# Patient Record
Sex: Male | Born: 1980 | Race: White | Hispanic: No | Marital: Single | Smoking: Current every day smoker
Health system: Southern US, Community
[De-identification: ages and names within clinical notes are randomized; demographics above are authoritative.]

## PROBLEM LIST (undated history)

## (undated) DIAGNOSIS — IMO0002 Reserved for concepts with insufficient information to code with codable children: Secondary | ICD-10-CM

## (undated) DIAGNOSIS — F319 Bipolar disorder, unspecified: Secondary | ICD-10-CM

## (undated) DIAGNOSIS — F419 Anxiety disorder, unspecified: Secondary | ICD-10-CM

## (undated) DIAGNOSIS — S0990XA Unspecified injury of head, initial encounter: Secondary | ICD-10-CM

## (undated) DIAGNOSIS — F329 Major depressive disorder, single episode, unspecified: Secondary | ICD-10-CM

## (undated) DIAGNOSIS — F32A Depression, unspecified: Secondary | ICD-10-CM

## (undated) DIAGNOSIS — R569 Unspecified convulsions: Secondary | ICD-10-CM

## (undated) DIAGNOSIS — S72009A Fracture of unspecified part of neck of unspecified femur, initial encounter for closed fracture: Secondary | ICD-10-CM

## (undated) HISTORY — PX: HIP SURGERY: SHX245

---

## 2003-10-13 ENCOUNTER — Inpatient Hospital Stay (HOSPITAL_COMMUNITY): Admission: RE | Admit: 2003-10-13 | Discharge: 2003-10-18 | Payer: Self-pay | Admitting: Psychiatry

## 2006-02-04 ENCOUNTER — Emergency Department (HOSPITAL_COMMUNITY): Admission: EM | Admit: 2006-02-04 | Discharge: 2006-02-04 | Payer: Self-pay | Admitting: *Deleted

## 2006-02-12 ENCOUNTER — Emergency Department (HOSPITAL_COMMUNITY): Admission: EM | Admit: 2006-02-12 | Discharge: 2006-02-12 | Payer: Self-pay | Admitting: *Deleted

## 2006-04-16 ENCOUNTER — Emergency Department (HOSPITAL_COMMUNITY): Admission: EM | Admit: 2006-04-16 | Discharge: 2006-04-16 | Payer: Self-pay | Admitting: Emergency Medicine

## 2007-06-19 ENCOUNTER — Ambulatory Visit: Payer: Self-pay | Admitting: Psychiatry

## 2007-06-19 ENCOUNTER — Inpatient Hospital Stay (HOSPITAL_COMMUNITY): Admission: EM | Admit: 2007-06-19 | Discharge: 2007-07-01 | Payer: Self-pay | Admitting: Psychiatry

## 2007-11-30 ENCOUNTER — Ambulatory Visit: Payer: Self-pay | Admitting: *Deleted

## 2007-11-30 ENCOUNTER — Inpatient Hospital Stay (HOSPITAL_COMMUNITY): Admission: AD | Admit: 2007-11-30 | Discharge: 2007-12-15 | Payer: Self-pay | Admitting: *Deleted

## 2008-01-15 ENCOUNTER — Emergency Department (HOSPITAL_COMMUNITY): Admission: EM | Admit: 2008-01-15 | Discharge: 2008-01-16 | Payer: Self-pay | Admitting: Emergency Medicine

## 2008-01-16 ENCOUNTER — Ambulatory Visit: Payer: Self-pay | Admitting: Psychiatry

## 2008-01-16 ENCOUNTER — Inpatient Hospital Stay (HOSPITAL_COMMUNITY): Admission: AD | Admit: 2008-01-16 | Discharge: 2008-01-29 | Payer: Self-pay | Admitting: Psychiatry

## 2008-02-04 ENCOUNTER — Inpatient Hospital Stay (HOSPITAL_COMMUNITY): Admission: RE | Admit: 2008-02-04 | Discharge: 2008-02-25 | Payer: Self-pay | Admitting: Psychiatry

## 2008-03-07 ENCOUNTER — Ambulatory Visit: Payer: Self-pay | Admitting: Psychiatry

## 2008-03-07 ENCOUNTER — Inpatient Hospital Stay (HOSPITAL_COMMUNITY): Admission: AD | Admit: 2008-03-07 | Discharge: 2008-03-08 | Payer: Self-pay | Admitting: Psychiatry

## 2008-09-11 ENCOUNTER — Inpatient Hospital Stay (HOSPITAL_COMMUNITY): Admission: RE | Admit: 2008-09-11 | Discharge: 2008-09-13 | Payer: Self-pay | Admitting: Psychiatry

## 2008-09-11 ENCOUNTER — Ambulatory Visit: Payer: Self-pay | Admitting: Psychiatry

## 2008-09-11 ENCOUNTER — Emergency Department (HOSPITAL_COMMUNITY): Admission: EM | Admit: 2008-09-11 | Discharge: 2008-09-11 | Payer: Self-pay | Admitting: Emergency Medicine

## 2009-01-05 ENCOUNTER — Emergency Department (HOSPITAL_COMMUNITY): Admission: EM | Admit: 2009-01-05 | Discharge: 2009-01-06 | Payer: Self-pay | Admitting: Emergency Medicine

## 2009-01-06 ENCOUNTER — Inpatient Hospital Stay (HOSPITAL_COMMUNITY): Admission: AD | Admit: 2009-01-06 | Discharge: 2009-01-16 | Payer: Self-pay | Admitting: *Deleted

## 2009-01-06 ENCOUNTER — Ambulatory Visit: Payer: Self-pay | Admitting: *Deleted

## 2009-05-08 ENCOUNTER — Ambulatory Visit: Payer: Self-pay | Admitting: Psychiatry

## 2009-05-08 ENCOUNTER — Emergency Department (HOSPITAL_COMMUNITY): Admission: EM | Admit: 2009-05-08 | Discharge: 2009-05-08 | Payer: Self-pay | Admitting: Emergency Medicine

## 2009-05-08 ENCOUNTER — Inpatient Hospital Stay (HOSPITAL_COMMUNITY): Admission: EM | Admit: 2009-05-08 | Discharge: 2009-06-04 | Payer: Self-pay | Admitting: Psychiatry

## 2009-11-10 ENCOUNTER — Other Ambulatory Visit: Payer: Self-pay | Admitting: Emergency Medicine

## 2009-11-14 ENCOUNTER — Emergency Department (HOSPITAL_COMMUNITY): Admission: EM | Admit: 2009-11-14 | Discharge: 2009-11-16 | Payer: Self-pay | Admitting: Emergency Medicine

## 2009-11-16 ENCOUNTER — Ambulatory Visit: Payer: Self-pay | Admitting: Psychiatry

## 2009-11-16 ENCOUNTER — Inpatient Hospital Stay (HOSPITAL_COMMUNITY): Admission: EM | Admit: 2009-11-16 | Discharge: 2009-12-03 | Payer: Self-pay | Admitting: Psychiatry

## 2010-04-12 ENCOUNTER — Emergency Department (HOSPITAL_COMMUNITY)
Admission: EM | Admit: 2010-04-12 | Discharge: 2010-04-14 | Payer: Self-pay | Source: Home / Self Care | Admitting: Emergency Medicine

## 2010-07-30 LAB — BASIC METABOLIC PANEL
CO2: 25 mEq/L (ref 19–32)
Calcium: 10.2 mg/dL (ref 8.4–10.5)
Chloride: 106 mEq/L (ref 96–112)
Creatinine, Ser: 1.14 mg/dL (ref 0.4–1.5)
GFR calc Af Amer: >60 mL/min (ref >60)
Glucose, Bld: 86 mg/dL (ref 70–99)
Potassium: 4.3 mEq/L (ref 3.5–5.1)
Sodium: 142 mEq/L (ref 135–145)

## 2010-07-30 LAB — TRICYCLICS SCREEN, URINE: TCA Scrn: NOT DETECTED

## 2010-07-30 LAB — DIFFERENTIAL
Basophils Absolute: 0 10*3/uL (ref 0.0–0.1)
Eosinophils Absolute: 0.1 10*3/uL (ref 0.0–0.7)
Lymphocytes Relative: 33 % (ref 12–46)
Neutro Abs: 5.3 10*3/uL (ref 1.7–7.7)
Neutrophils Relative %: 60 % (ref 43–77)

## 2010-07-30 LAB — CBC
Platelets: 373 10*3/uL (ref 150–400)
RBC: 5.11 MIL/uL (ref 4.22–5.81)

## 2010-07-30 LAB — RAPID URINE DRUG SCREEN, HOSP PERFORMED: Amphetamines: NOT DETECTED

## 2010-08-03 LAB — VALPROIC ACID LEVEL: Valproic Acid Lvl: 46.3 ug/mL — ABNORMAL LOW (ref 50.0–100.0)

## 2010-08-04 LAB — CBC
HCT: 38.8 % — ABNORMAL LOW (ref 39.0–52.0)
HCT: 42.7 % (ref 39.0–52.0)
Hemoglobin: 13.5 g/dL (ref 13.0–17.0)
MCH: 33.1 pg (ref 26.0–34.0)
MCH: 33.3 pg (ref 26.0–34.0)
MCHC: 34.9 g/dL (ref 30.0–36.0)
MCHC: 34.9 g/dL (ref 30.0–36.0)
MCV: 92.2 fL (ref 78.0–100.0)
MCV: 94.6 fL (ref 78.0–100.0)
MCV: 95.2 fL (ref 78.0–100.0)
Platelets: 304 10*3/uL (ref 150–400)
Platelets: 334 10*3/uL (ref 150–400)
Platelets: 352 10*3/uL (ref 150–400)
Platelets: 484 10*3/uL — ABNORMAL HIGH (ref 150–400)
RBC: 4.51 MIL/uL (ref 4.22–5.81)
RBC: 4.65 MIL/uL (ref 4.22–5.81)
RBC: 4.9 MIL/uL (ref 4.22–5.81)
RDW: 12.7 % (ref 11.5–15.5)
WBC: 6.2 10*3/uL (ref 4.0–10.5)

## 2010-08-04 LAB — VALPROIC ACID LEVEL
Valproic Acid Lvl: 109.2 ug/mL — ABNORMAL HIGH (ref 50.0–100.0)
Valproic Acid Lvl: 48 ug/mL — ABNORMAL LOW (ref 50.0–100.0)
Valproic Acid Lvl: 54 ug/mL (ref 50.0–100.0)
Valproic Acid Lvl: 71.8 ug/mL (ref 50.0–100.0)
Valproic Acid Lvl: <10 ug/mL — ABNORMAL LOW (ref 50.0–100.0)

## 2010-08-04 LAB — DIFFERENTIAL
Basophils Absolute: 0 10*3/uL (ref 0.0–0.1)
Basophils Relative: 0 % (ref 0–1)
Eosinophils Absolute: 0.1 10*3/uL (ref 0.0–0.7)
Eosinophils Absolute: 0.1 10*3/uL (ref 0.0–0.7)
Eosinophils Relative: 1 % (ref 0–5)
Eosinophils Relative: 2 % (ref 0–5)
Lymphocytes Relative: 38 % (ref 12–46)
Lymphs Abs: 1.8 10*3/uL (ref 0.7–4.0)
Lymphs Abs: 2.7 10*3/uL (ref 0.7–4.0)
Monocytes Absolute: 0.5 10*3/uL (ref 0.1–1.0)
Monocytes Relative: 13 % — ABNORMAL HIGH (ref 3–12)
Neutro Abs: 3.9 10*3/uL (ref 1.7–7.7)
Neutrophils Relative %: 46 % (ref 43–77)
Neutrophils Relative %: 83 % — ABNORMAL HIGH (ref 43–77)

## 2010-08-04 LAB — HEPATIC FUNCTION PANEL
ALT: 16 U/L (ref 0–53)
AST: 16 U/L (ref 0–37)
AST: 17 U/L (ref 0–37)
Albumin: 3.3 g/dL — ABNORMAL LOW (ref 3.5–5.2)
Albumin: 3.6 g/dL (ref 3.5–5.2)
Alkaline Phosphatase: 61 U/L (ref 39–117)
Alkaline Phosphatase: 66 U/L (ref 39–117)
Bilirubin, Direct: 0.1 mg/dL (ref 0.0–0.3)
Indirect Bilirubin: 0.6 mg/dL (ref 0.3–0.9)
Total Protein: 6.6 g/dL (ref 6.0–8.3)

## 2010-08-04 LAB — AMMONIA: Ammonia: 46 umol/L — ABNORMAL HIGH (ref 11–35)

## 2010-08-04 LAB — POCT I-STAT, CHEM 8
BUN: 11 mg/dL (ref 6–23)
Calcium, Ion: 1.17 mmol/L (ref 1.12–1.32)
Creatinine, Ser: 0.8 mg/dL (ref 0.4–1.5)
Hemoglobin: 15 g/dL (ref 13.0–17.0)
Sodium: 140 mEq/L (ref 135–145)
TCO2: 27 mmol/L (ref 0–100)

## 2010-08-04 LAB — COMPREHENSIVE METABOLIC PANEL
ALT: 14 U/L (ref 0–53)
AST: 14 U/L (ref 0–37)
Albumin: 4.5 g/dL (ref 3.5–5.2)
Alkaline Phosphatase: 78 U/L (ref 39–117)
BUN: 10 mg/dL (ref 6–23)
Calcium: 9.3 mg/dL (ref 8.4–10.5)
Chloride: 93 mEq/L — ABNORMAL LOW (ref 96–112)
Creatinine, Ser: 0.85 mg/dL (ref 0.4–1.5)
GFR calc Af Amer: >60 mL/min (ref >60)
Glucose, Bld: 97 mg/dL (ref 70–99)
Potassium: 4.4 mEq/L (ref 3.5–5.1)
Sodium: 135 mEq/L (ref 135–145)

## 2010-08-04 LAB — RAPID URINE DRUG SCREEN, HOSP PERFORMED
Amphetamines: NOT DETECTED
Barbiturates: NOT DETECTED
Benzodiazepines: NOT DETECTED
Cocaine: NOT DETECTED
Opiates: NOT DETECTED

## 2010-08-04 LAB — BASIC METABOLIC PANEL
BUN: 11 mg/dL (ref 6–23)
CO2: 25 mEq/L (ref 19–32)
Calcium: 9.3 mg/dL (ref 8.4–10.5)
Chloride: 104 mEq/L (ref 96–112)
Creatinine, Ser: 1.19 mg/dL (ref 0.4–1.5)
Glucose, Bld: 106 mg/dL — ABNORMAL HIGH (ref 70–99)

## 2010-08-19 LAB — LIPID PANEL
HDL: 40 mg/dL (ref >39)
Total CHOL/HDL Ratio: 4.7 RATIO

## 2010-08-19 LAB — COMPREHENSIVE METABOLIC PANEL
ALT: 27 U/L (ref 0–53)
Alkaline Phosphatase: 92 U/L (ref 39–117)
BUN: 9 mg/dL (ref 6–23)
CO2: 25 mEq/L (ref 19–32)
GFR calc non Af Amer: >60 mL/min (ref >60)
Glucose, Bld: 139 mg/dL — ABNORMAL HIGH (ref 70–99)
Potassium: 3.5 mEq/L (ref 3.5–5.1)
Sodium: 140 mEq/L (ref 135–145)
Total Bilirubin: 0.9 mg/dL (ref 0.3–1.2)

## 2010-08-19 LAB — DIFFERENTIAL
Basophils Absolute: 0 10*3/uL (ref 0.0–0.1)
Basophils Relative: 0 % (ref 0–1)
Eosinophils Absolute: 0 10*3/uL (ref 0.0–0.7)
Neutrophils Relative %: 81 % — ABNORMAL HIGH (ref 43–77)

## 2010-08-19 LAB — ETHANOL: Alcohol, Ethyl (B): <5 mg/dL (ref 0–10)

## 2010-08-19 LAB — CBC
HCT: 46.5 % (ref 39.0–52.0)
Hemoglobin: 16.1 g/dL (ref 13.0–17.0)
RBC: 5.03 MIL/uL (ref 4.22–5.81)

## 2010-08-19 LAB — RPR: RPR Ser Ql: NONREACTIVE

## 2010-08-19 LAB — RAPID URINE DRUG SCREEN, HOSP PERFORMED
Opiates: NOT DETECTED
Tetrahydrocannabinol: NOT DETECTED

## 2010-08-19 LAB — TSH: TSH: 1.387 u[IU]/mL (ref 0.350–4.500)

## 2010-08-19 LAB — VALPROIC ACID LEVEL: Valproic Acid Lvl: 28.8 ug/mL — ABNORMAL LOW (ref 50.0–100.0)

## 2010-08-24 LAB — URINALYSIS, ROUTINE W REFLEX MICROSCOPIC
Glucose, UA: NEGATIVE mg/dL
Ketones, ur: NEGATIVE mg/dL
Protein, ur: NEGATIVE mg/dL
Urobilinogen, UA: 0.2 mg/dL (ref 0.0–1.0)

## 2010-08-24 LAB — DIFFERENTIAL
Basophils Relative: 0 % (ref 0–1)
Eosinophils Relative: 2 % (ref 0–5)
Monocytes Absolute: 0.9 10*3/uL (ref 0.1–1.0)
Monocytes Relative: 9 % (ref 3–12)
Neutro Abs: 6.7 10*3/uL (ref 1.7–7.7)

## 2010-08-24 LAB — COMPREHENSIVE METABOLIC PANEL
AST: 18 U/L (ref 0–37)
Albumin: 4.1 g/dL (ref 3.5–5.2)
Alkaline Phosphatase: 85 U/L (ref 39–117)
BUN: 5 mg/dL — ABNORMAL LOW (ref 6–23)
Chloride: 107 mEq/L (ref 96–112)
GFR calc Af Amer: >60 mL/min (ref >60)
Potassium: 3.3 mEq/L — ABNORMAL LOW (ref 3.5–5.1)
Sodium: 143 mEq/L (ref 135–145)
Total Bilirubin: 0.2 mg/dL — ABNORMAL LOW (ref 0.3–1.2)
Total Protein: 7.4 g/dL (ref 6.0–8.3)

## 2010-08-24 LAB — ETHANOL: Alcohol, Ethyl (B): 22 mg/dL — ABNORMAL HIGH (ref 0–10)

## 2010-08-24 LAB — RAPID URINE DRUG SCREEN, HOSP PERFORMED: Benzodiazepines: NOT DETECTED

## 2010-08-24 LAB — CBC
Hemoglobin: 13.6 g/dL (ref 13.0–17.0)
Platelets: 366 10*3/uL (ref 150–400)
RDW: 12.5 % (ref 11.5–15.5)

## 2010-08-24 LAB — VALPROIC ACID LEVEL: Valproic Acid Lvl: 22.4 ug/mL — ABNORMAL LOW (ref 50.0–100.0)

## 2010-08-24 LAB — ACETAMINOPHEN LEVEL: Acetaminophen (Tylenol), Serum: <10 ug/mL — ABNORMAL LOW (ref 10–30)

## 2010-08-28 LAB — POCT I-STAT, CHEM 8
BUN: 6 mg/dL (ref 6–23)
Chloride: 108 mEq/L (ref 96–112)
Creatinine, Ser: 0.7 mg/dL (ref 0.4–1.5)
Hemoglobin: 16.3 g/dL (ref 13.0–17.0)
Potassium: 3.9 mEq/L (ref 3.5–5.1)
Sodium: 141 mEq/L (ref 135–145)

## 2010-08-28 LAB — ETHANOL: Alcohol, Ethyl (B): 76 mg/dL — ABNORMAL HIGH (ref 0–10)

## 2010-08-28 LAB — RAPID URINE DRUG SCREEN, HOSP PERFORMED
Cocaine: POSITIVE — AB
Opiates: POSITIVE — AB
Tetrahydrocannabinol: NOT DETECTED
Tetrahydrocannabinol: POSITIVE — AB

## 2010-08-28 LAB — URINE CULTURE: Culture: NO GROWTH

## 2010-08-28 LAB — URINALYSIS, ROUTINE W REFLEX MICROSCOPIC
Glucose, UA: NEGATIVE mg/dL
Protein, ur: NEGATIVE mg/dL
Specific Gravity, Urine: 1.003 — ABNORMAL LOW (ref 1.005–1.030)
pH: 5.5 (ref 5.0–8.0)

## 2010-08-28 LAB — DIFFERENTIAL
Lymphocytes Relative: 13 % (ref 12–46)
Lymphs Abs: 1.7 10*3/uL (ref 0.7–4.0)
Neutro Abs: 10.2 10*3/uL — ABNORMAL HIGH (ref 1.7–7.7)
Neutrophils Relative %: 81 % — ABNORMAL HIGH (ref 43–77)

## 2010-08-28 LAB — COMPREHENSIVE METABOLIC PANEL
CO2: 19 mEq/L (ref 19–32)
Calcium: 9.2 mg/dL (ref 8.4–10.5)
Creatinine, Ser: 0.67 mg/dL (ref 0.4–1.5)
GFR calc Af Amer: >60 mL/min (ref >60)
GFR calc non Af Amer: >60 mL/min (ref >60)
Glucose, Bld: 79 mg/dL (ref 70–99)
Total Protein: 7 g/dL (ref 6.0–8.3)

## 2010-08-28 LAB — CBC
Hemoglobin: 15.2 g/dL (ref 13.0–17.0)
MCHC: 34.7 g/dL (ref 30.0–36.0)
MCV: 95 fL (ref 78.0–100.0)
RBC: 4.61 MIL/uL (ref 4.22–5.81)
RDW: 13.2 % (ref 11.5–15.5)
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
## 2010-10-01 NOTE — Discharge Summary (Signed)
[name], [name] NO.:  [id number]   MEDICAL RECORD NO.:  [id number]          PATIENT TYPE:  IPS   LOCATION:  0304                          FACILITY:  [facility]   PHYSICIAN:  Jasmine Pang, M.D. DATE OF BIRTH:  Nov 28, 1980   DATE OF ADMISSION:  01/06/2009  DATE OF DISCHARGE:  01/16/2009                               DISCHARGE SUMMARY   IDENTIFICATION:  This is a 30 year old single white male who was  admitted on a voluntary basis on January 06, 2009.   HISTORY OF PRESENT ILLNESS:  The patient presented to the [facility] earlier in the day. On the day before admission, he reported he  was very depressed with crying spells.  He stated he had been thinking  of killing himself by using a gun, to shoot himself or to jump in front  of the car.  He stated he did not want to harm anyone else and has no  auditory or visual hallucinations.  He is currently followed at [facility]  in [place] by Dr. Cheree Sullivan.  He states that he keeps his appointments and  receives [facility] for  therapeutic alliance.  He  denied any substance abuse issues and wanted inpatient hospitalization.  He was last with [place] in April 2010.  Last year, he had several admissions  with [place] January 16, 2008 to January 29, 2008, February 04, 2008 to  February 25, 2008 and March 07, 2008 to March 08, 2008.  For further  admission information, see psychiatric admission assessment.  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  On Axis III, he was given the diagnosis of  chronic back and knee pain.   PHYSICAL FINDINGS:  The patient was medically cleared in the ED at  [facility].  There were no acute medical or physical problems noted.   DIAGNOSTIC STUDIES:  The patient had no abnormal lab findings other than  a slightly elevated alcohol level was 22.  He was reported recently  having a bud light.   HOSPITAL COURSE:  Upon  admission, the patient's CBC was started on  Motrin 600 mg p.o. q.6 h. p.r.n. pain and his home medications of  Seroquel 400 mg daily, Depakote 500 mg b.i.d., Celexa 20 mg daily, and  Haldol 10 mg daily.  In individual sessions, the patient was initially  very irritable with poor eye contact.  Speech was slow, but coherent.  Mood was depressed, anxious.  There was positive suicidal ideation, but  no auditory or visual hallucinations.  No homicidal ideation.  He had  limited insight and judgment with fair impulse control.  As  hospitalization progressed, he continued to be depressed and anxious.  He discussed the death of his great-grandmother, this has been a major  stress for him.  He stated he became very depressed and suicidal.  He  was complaining of what appeared to be acathisia with some tremor in his  upper extremities.  He was started on Cogentin 1 mg p.o. b.i.d. to treat  this.  He was also  having trouble sleeping, so was started on Ambien 10  mg p.o. q.h.s. p.r.n. insomnia may repeat times one.  We called [facility], his pharmacy, to clarify the Percocet dose.  Prior to admission,  he was restarted on Percocet 5/325 two p.o. q.6 h. p.r.n. pain.  He was  also started on Ensure t.i.d. p.r.n. to help increase his calorie  intake.  On January 10, 2009, he was still depressed and anxious with  positive suicidal ideation.  However, sleep had improved.  He was having  no side effects.  He felt the Cogentin was helping with the tremor from  the Haldol.  As hospitalization progressed, he became somewhat less  depressed and less anxious.  He was not suicidal.  The patient was  accepted at [facility], community support worker was going to pick him up to  get him to this program.  Sleep was good on January 16, 2009.  Mood was  less depressed, less anxious, and less irritable.  Affect was consistent  with mood.  There was no suicidal or homicidal ideation.  No thoughts of  self injurious  behavior.  No auditory or visual hallucinations.  No  paranoia or delusions.  Thoughts were logical and goal-directed.  Thought content, no predominant theme.  Cognitive grossly intact.  Insight fair.  Judgment fair.  Impulse control fair.  The patient was  wanting to be discharged today the [facility] program today and it was felt  he was safe for discharge.  The patient's Celexa was increased to 30 mg  p.o. q.a.m. prior to discharge and his Haldol was decreased to 5 mg p.o.  q.a.m. due to his concerns about EPS and sedation.   DISCHARGE DIAGNOSES:  Axis I:  Mood disorder, not otherwise specified.  Axis II:  Features of personality disorder, not otherwise specified.  Axis III:  Chronic back and neck pain.  Axis IV:  Severe (problems with primary support group, other  psychosocial problems, burden of psychiatric illness, burden of medical  problems).  Axis V:  Global assessment of functioning upon discharge was 50.  Global  assessment of functioning upon admission was 30.  Global assessment of  functioning highest past year was 60-65.   DISCHARGE PLANS:  There were no activity level or dietary restrictions.   POSTHOSPITAL CARE PLANS:  The patient will go to the [facility] program  today, his community services worker will pick him.   DISCHARGE MEDICATIONS:  1. Percocet was stopped.  2. Depakote 500 mg twice a day.  3. Seroquel 400 mg h.s.  4. Cogentin 1 mg b.i.d.  5. Haldol 5 mg every a.m.  6. Celexa 30 mg every a.m.  7. Ibuprofen 800 mg 3 times daily as needed for pain.  8. Tylenol 650 q.4 h. p.r.n. pain.      Jasmine Pang, M.D.  Electronically Signed     BHS/MEDQ  D:  01/16/2009  T:  01/17/2009  Job:  [id number]
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
## 2011-01-22 ENCOUNTER — Emergency Department (HOSPITAL_COMMUNITY)
Admission: EM | Admit: 2011-01-22 | Discharge: 2011-01-22 | Disposition: A | Discharge status: Home / Self Care | Payer: Medicaid Other | Attending: Emergency Medicine | Admitting: Emergency Medicine

## 2011-01-22 ENCOUNTER — Emergency Department (HOSPITAL_COMMUNITY): Payer: Medicaid Other

## 2011-01-22 DIAGNOSIS — N509 Disorder of male genital organs, unspecified: Secondary | ICD-10-CM | POA: Insufficient documentation

## 2011-01-22 DIAGNOSIS — N433 Hydrocele, unspecified: Secondary | ICD-10-CM | POA: Insufficient documentation

## 2011-01-22 DIAGNOSIS — N453 Epididymo-orchitis: Secondary | ICD-10-CM | POA: Insufficient documentation

## 2011-01-22 LAB — URINALYSIS, ROUTINE W REFLEX MICROSCOPIC
Bilirubin Urine: NEGATIVE
Leukocytes, UA: NEGATIVE
Nitrite: NEGATIVE
Specific Gravity, Urine: 1.021 (ref 1.005–1.030)
pH: 8 (ref 5.0–8.0)

## 2011-02-04 ENCOUNTER — Emergency Department (HOSPITAL_COMMUNITY)
Admission: EM | Admit: 2011-02-04 | Discharge: 2011-02-04 | Disposition: A | Discharge status: Home / Self Care | Payer: Medicaid Other | Attending: Emergency Medicine | Admitting: Emergency Medicine

## 2011-02-04 DIAGNOSIS — L738 Other specified follicular disorders: Secondary | ICD-10-CM | POA: Insufficient documentation

## 2011-02-04 DIAGNOSIS — R599 Enlarged lymph nodes, unspecified: Secondary | ICD-10-CM | POA: Insufficient documentation

## 2011-02-07 LAB — VALPROIC ACID LEVEL: Valproic Acid Lvl: 35.1 — ABNORMAL LOW

## 2011-02-17 LAB — DRUGS OF ABUSE SCREEN W/O ALC, ROUTINE URINE
Cocaine Metabolites: NEGATIVE
Creatinine,U: 30.8
Opiate Screen, Urine: NEGATIVE
Propoxyphene: NEGATIVE

## 2011-02-17 LAB — URINALYSIS, ROUTINE W REFLEX MICROSCOPIC
Protein, ur: NEGATIVE
Urobilinogen, UA: 0.2

## 2011-02-17 LAB — VALPROIC ACID LEVEL
Valproic Acid Lvl: 50.8
Valproic Acid Lvl: 33.2 — ABNORMAL LOW

## 2011-09-24 ENCOUNTER — Emergency Department (HOSPITAL_COMMUNITY)
Admission: EM | Admit: 2011-09-24 | Discharge: 2011-09-24 | Discharge status: Against Medical Advice | Payer: Medicaid Other | Source: Home / Self Care | Attending: Emergency Medicine | Admitting: Emergency Medicine

## 2011-09-24 ENCOUNTER — Encounter (HOSPITAL_COMMUNITY): Payer: Self-pay

## 2011-09-24 DIAGNOSIS — G8929 Other chronic pain: Secondary | ICD-10-CM

## 2011-09-24 HISTORY — DX: Reserved for concepts with insufficient information to code with codable children: IMO0002

## 2011-09-24 HISTORY — DX: Fracture of unspecified part of neck of unspecified femur, initial encounter for closed fracture: S72.009A

## 2011-09-24 MED ORDER — MELOXICAM 15 MG PO TABS: 15.0000 mg | ORAL_TABLET | Freq: Every day | ORAL | Status: DC

## 2011-09-24 NOTE — Discharge Instructions (Signed)
We will refer you to the [facility] for ongoing pain management. They will contact you in a week to make an appointment. In the meantime, you will need to have all of your narcotic prescriptions filled by your current provider.   care providers appreciate that many patients coming to [place] are in severe pain and we wish to address their pain in the safest, most responsible manner.  It is important to recognize however, that the proper treatment of chronic pain differs from that of the pain of injuries and acute illnesses.  Our goal is to provide quality, safe, personalized care and we thank you for giving [place] the opportunity to serve you. The use of narcotics and related agents for chronic pain syndromes may lead to additional physical and psychological problems.  Nearly as many people die from prescription narcotics each year as die from car crashes.  Additionally, this risk is increased if such prescriptions are obtained from a variety of sources.  Therefore, only your primary care physician or a pain management specialist is able to safely treat such syndromes with narcotic medications long-term.    Documentation revealing such prescriptions have been sought from multiple sources may prohibit [place] from providing a refill or different narcotic medication.  Your name may be checked first through the [facility] Controlled Substances Reporting System.  This database is a record of controlled substance medication prescriptions that the patient has received.  This has been established by [facility] in an effort to eliminate the dangerous, and often life threatening, practice of obtaining multiple prescriptions from different medical providers.   If you have a chronic pain syndrome (i.e. chronic headaches, recurrent back or neck pain, dental pain, abdominal or pelvis pain without a specific diagnosis, or neuropathic pain such as fibromyalgia) or recurrent visits for the same  condition without an acute diagnosis, you may be treated with non-narcotics and other non-addictive medicines.  Allergic reactions or negative side effects that may be reported by a patient to such medications will not typically lead to the use of a narcotic analgesic or other controlled substance as an alternative.   Patients managing chronic pain with a personal physician should have provisions in place for breakthrough pain.  If you are in crisis, you should call your physician.  If your physician directs you to the emergency department, please have the doctor call and speak to our attending physician concerning your care.   When patients come to the Emergency Department (ED) with acute medical conditions in which the Emergency Department physician feels appropriate to prescribe narcotic or sedating pain medication, the physician will prescribe these in very limited quantities.  The amount of these medications will last only until you can see your primary care physician in his/her office.  Any patient who returns to the ED seeking refills should expect only non-narcotic pain medications.   In the event of an acute medical condition exists and the emergency physician feels it is necessary that the patient be given a narcotic or sedating medication -  a responsible adult driver should be present in the room prior to the medication being given by the nurse.   Prescriptions for narcotic or sedating medications that have been lost, stolen or expired will not be refilled in the Emergency Department.    Patients who have chronic pain may receive non-narcotic prescriptions until seen by their primary care physician.  It is every patient's personal responsibility to maintain active prescriptions with his  or her primary care physician or specialist.
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
## 2011-09-24 NOTE — ED Notes (Signed)
Door to pt room open,unable to locate pt; pt not in restroom, not in lobby, not observed outside ; presumed to have left [facility] . Per Dr Chaney Malling, we have initiate referral process to have pain management assist pt, and have offered him a Rx for meloxicam for discomfort. Per [place] ATF/narcotics records, pt should still have 10 days remaining on his last vicodin rx from [facility] office
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
## 2011-10-01 NOTE — ED Notes (Signed)
5/10  Referral request received from Dr. Chaney Malling for pt. to be seen at the [facility] for Pain and Rehab. Medicine. Referral form filled out and faxed with chart to [phone] and confirmation received.  5/14 Fax received back from them saying, we do not have any additional pain /rehab management options to offer your patient. 5/15  Dr. Chaney Malling notified and she said no further action needed. Vassie Moselle 10/01/2011

## 2011-10-16 ENCOUNTER — Emergency Department (HOSPITAL_COMMUNITY): Payer: Medicaid Other

## 2011-10-16 ENCOUNTER — Encounter (HOSPITAL_COMMUNITY): Payer: Self-pay | Admitting: Emergency Medicine

## 2011-10-16 ENCOUNTER — Emergency Department (HOSPITAL_COMMUNITY)
Admission: EM | Admit: 2011-10-16 | Discharge: 2011-10-16 | Disposition: A | Discharge status: Home / Self Care | Payer: Medicaid Other | Attending: Emergency Medicine | Admitting: Emergency Medicine

## 2011-10-16 DIAGNOSIS — R0602 Shortness of breath: Secondary | ICD-10-CM | POA: Insufficient documentation

## 2011-10-16 DIAGNOSIS — R079 Chest pain, unspecified: Secondary | ICD-10-CM | POA: Insufficient documentation

## 2011-10-16 DIAGNOSIS — F17200 Nicotine dependence, unspecified, uncomplicated: Secondary | ICD-10-CM | POA: Insufficient documentation

## 2011-10-16 LAB — POCT I-STAT, CHEM 8
Calcium, Ion: 1.22 mmol/L (ref 1.12–1.32)
Glucose, Bld: 91 mg/dL (ref 70–99)
HCT: 49 % (ref 39.0–52.0)
Hemoglobin: 16.7 g/dL (ref 13.0–17.0)
Potassium: 4 mEq/L (ref 3.5–5.1)

## 2011-10-16 LAB — POCT I-STAT TROPONIN I: Troponin i, poc: 0 ng/mL (ref 0.00–0.08)

## 2011-10-16 MED ORDER — OMEPRAZOLE 20 MG PO CPDR: 20.0000 mg | DELAYED_RELEASE_CAPSULE | Freq: Every day | ORAL | Status: DC

## 2011-10-16 MED ORDER — GI COCKTAIL ~~LOC~~
30.0000 mL | Freq: Once | ORAL | Status: AC
  Administered 2011-10-16: 30 mL via ORAL
  Filled 2011-10-16: qty 30
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
## 2011-10-16 NOTE — Discharge Instructions (Signed)
Chest Pain (Nonspecific) It is often hard to give a specific diagnosis for the cause of chest pain. There is always a chance that your pain could be related to something serious, such as a heart attack or a blood clot in the lungs. You need to follow up with your caregiver for further evaluation. CAUSES   Heartburn.   Pneumonia or bronchitis.   Anxiety or stress.   Inflammation around your heart (pericarditis) or lung (pleuritis or pleurisy).   A blood clot in the lung.   A collapsed lung (pneumothorax). It can develop suddenly on its own (spontaneous pneumothorax) or from injury (trauma) to the chest.   Shingles infection (herpes zoster virus).  The chest wall is composed of bones, muscles, and cartilage. Any of these can be the source of the pain.  The bones can be bruised by injury.   The muscles or cartilage can be strained by coughing or overwork.   The cartilage can be affected by inflammation and become sore (costochondritis).  DIAGNOSIS  Lab tests or other studies, such as X-rays, electrocardiography, stress testing, or cardiac imaging, may be needed to find the cause of your pain.  TREATMENT   Treatment depends on what may be causing your chest pain. Treatment may include:   Acid blockers for heartburn.   Anti-inflammatory medicine.   Pain medicine for inflammatory conditions.   Antibiotics if an infection is present.   You may be advised to change lifestyle habits. This includes stopping smoking and avoiding alcohol, caffeine, and chocolate.   You may be advised to keep your head raised (elevated) when sleeping. This reduces the chance of acid going backward from your stomach into your esophagus.   Most of the time, nonspecific chest pain will improve within 2 to 3 days with rest and mild pain medicine.  HOME CARE INSTRUCTIONS   If antibiotics were prescribed, take your antibiotics as directed. Finish them even if you start to feel better.   For the next few  days, avoid physical activities that bring on chest pain. Continue physical activities as directed.   Do not smoke.   Avoid drinking alcohol.   Only take over-the-counter or prescription medicine for pain, discomfort, or fever as directed by your caregiver.   Follow your caregiver's suggestions for further testing if your chest pain does not go away.   Keep any follow-up appointments you made. If you do not go to an appointment, you could develop lasting (chronic) problems with pain. If there is any problem keeping an appointment, you must call to reschedule.  SEEK MEDICAL CARE IF:   You think you are having problems from the medicine you are taking. Read your medicine instructions carefully.   Your chest pain does not go away, even after treatment.   You develop a rash with blisters on your chest.  SEEK IMMEDIATE MEDICAL CARE IF:   You have increased chest pain or pain that spreads to your arm, neck, jaw, back, or abdomen.   You develop shortness of breath, an increasing cough, or you are coughing up blood.   You have severe back or abdominal pain, feel nauseous, or vomit.   You develop severe weakness, fainting, or chills.   You have a fever.  THIS IS AN EMERGENCY. Do not wait to see if the pain will go away. Get medical help at once. Call your local emergency services (911 in [place] Do not drive yourself to the hospital. MAKE SURE YOU:   Understand these instructions.     Will watch your condition.   Will get help right away if you are not doing well or get worse.  Document Released: 02/12/2005 Document Revised: 04/24/2011 Document Reviewed: 12/09/2007 Exit[employer] Patient Information 2012 [employer]. 

## 2011-10-16 NOTE — ED Notes (Signed)
Pt presents with a  C/c of chest pain that radiates through to his back and down his left arm.  St's the pain started when he was laying down in bed.  Nothing seems to make it better, pt st's nothing really makes it worse, says the pain is constant.  Anginal equivalents include SOB, lightheadedness, and dizziness.  No nausea or vomiting.  Family hx of heart problems.

## 2011-10-16 NOTE — ED Notes (Signed)
Pt presents with c/o chest pain that started about 45 min ago  Pt states pain is in the middle of his chest and radiates through to his back and into his left arm  Family states pt turned blue and was incoherent for about 3 minutes

## 2012-06-28 IMAGING — CT CT HEAD W/O CM
1 series · 16 of 30 positions shown, 20 images · non-contrast
Comparison: 09/11/2008

CLINICAL DATA: Altered mental status.

CT HEAD WITHOUT CONTRAST
TECHNIQUE: Contiguous axial images were obtained from the base of
the skull through the vertex without contrast.

[Series 2: head_seq 4.5 h37s st · axial · 0.46mm/px · z∈[-125,+19]mm · 16 of 36 slices shown, 20 images]
[im 2/36  brain]
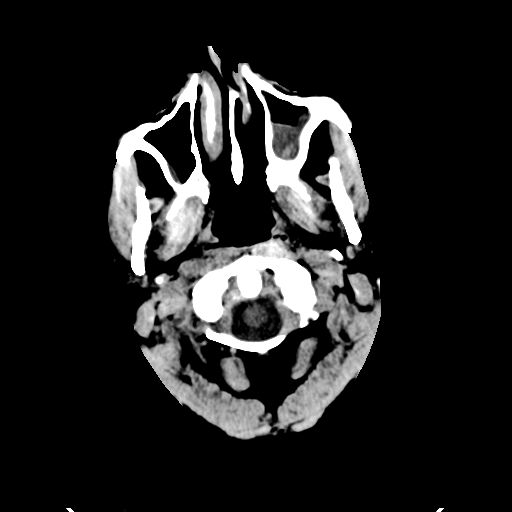
[im 2/36  bone]
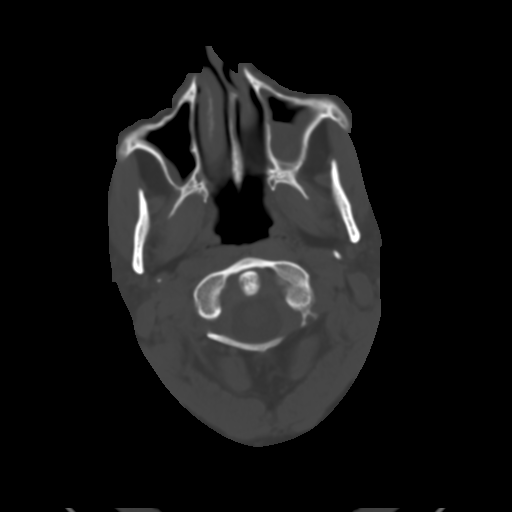
[im 4/36  brain]
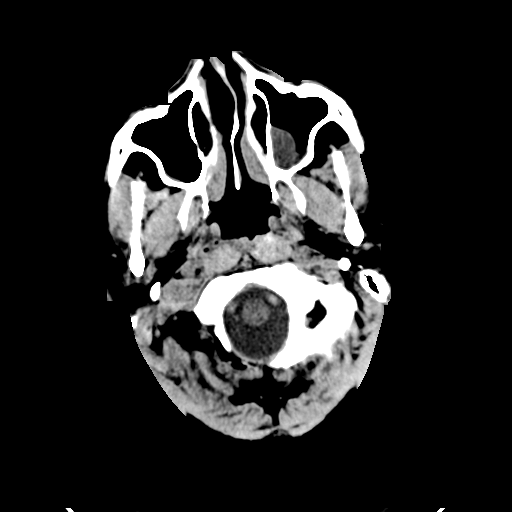
[im 7/36  brain]
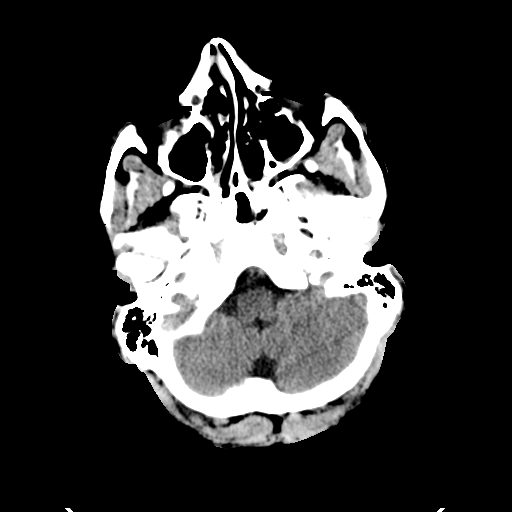
[im 9/36  brain]
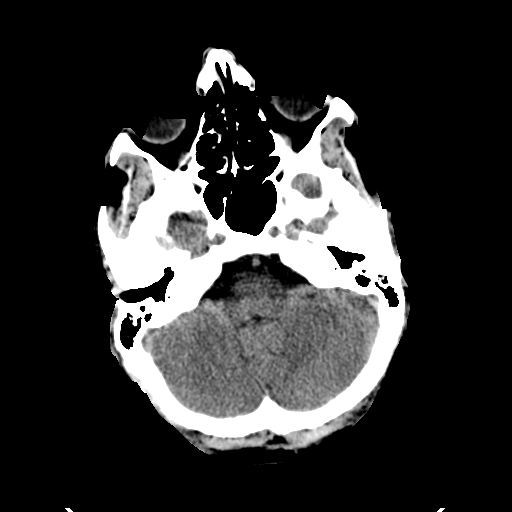
[im 10/36  brain]
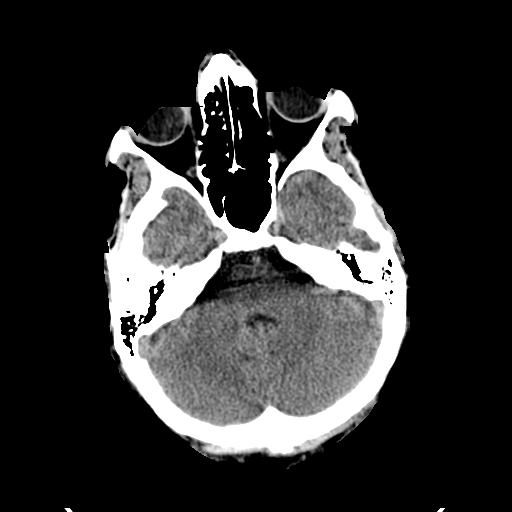
[im 10/36  bone]
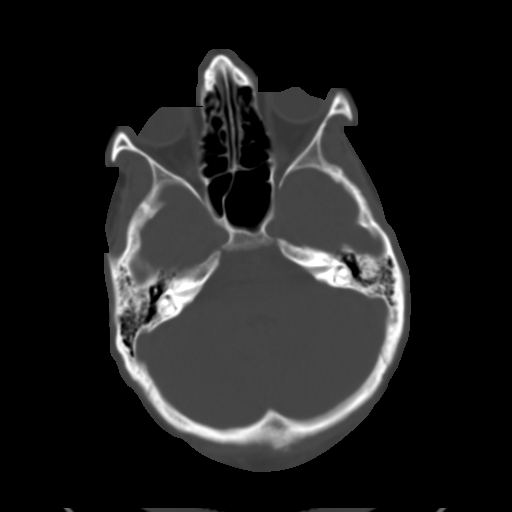
[im 13/36  brain]
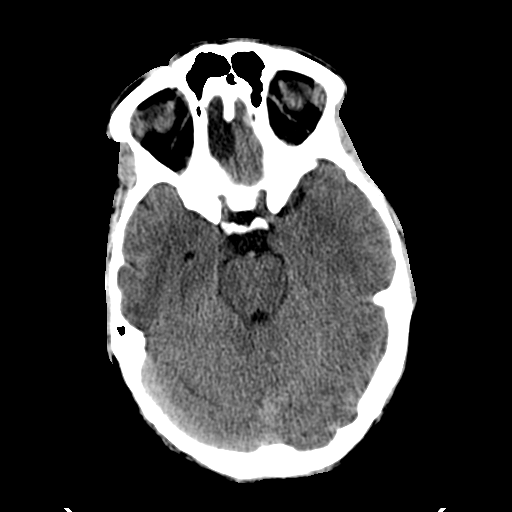
[im 15/36  brain]
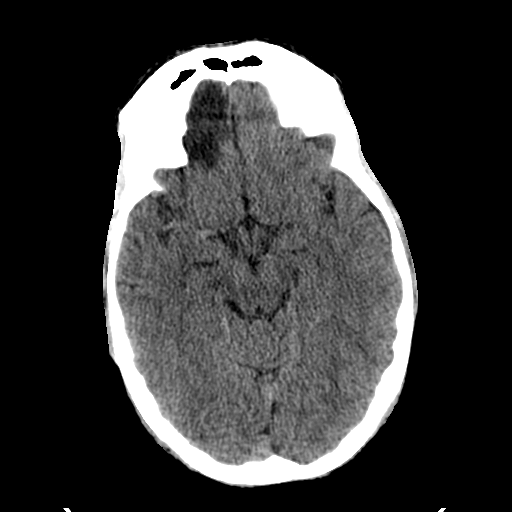
[im 17/36  brain]
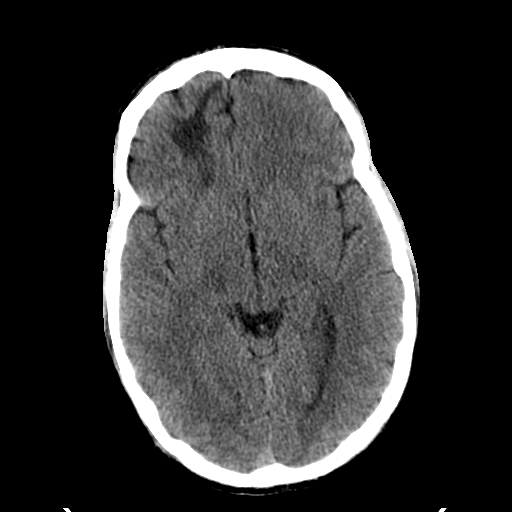
[im 19/36  brain]
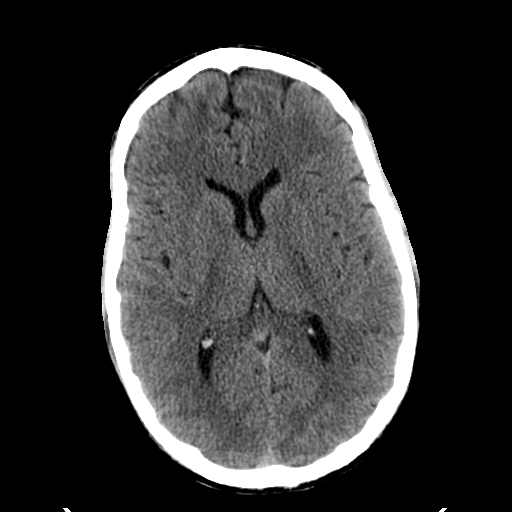
[im 19/36  bone]
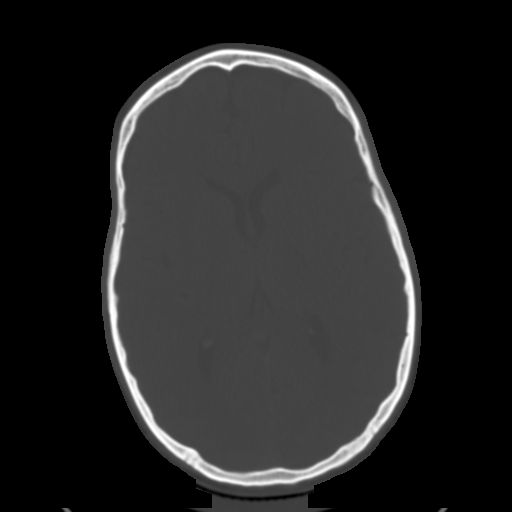
[im 21/36  brain]
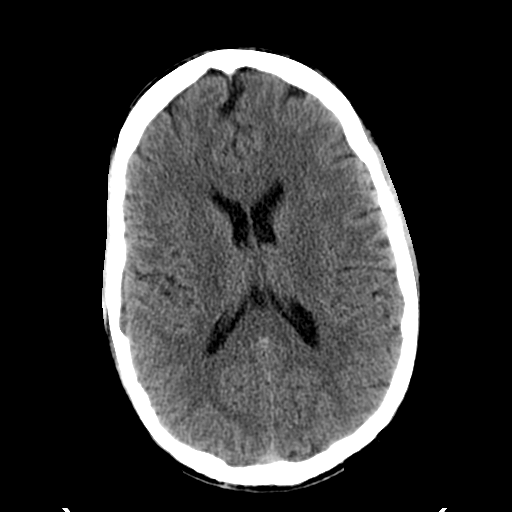
[im 23/36  brain]
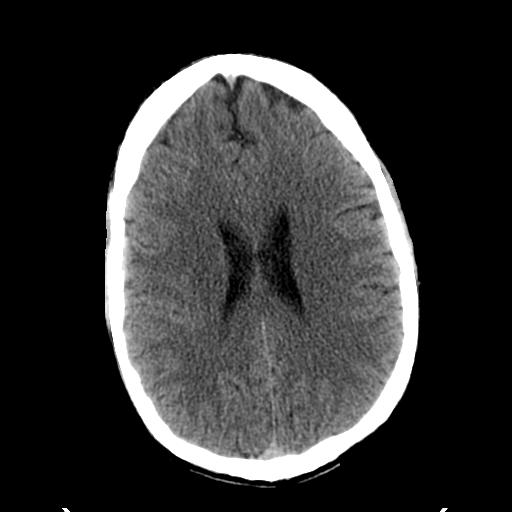
[im 26/36  brain]
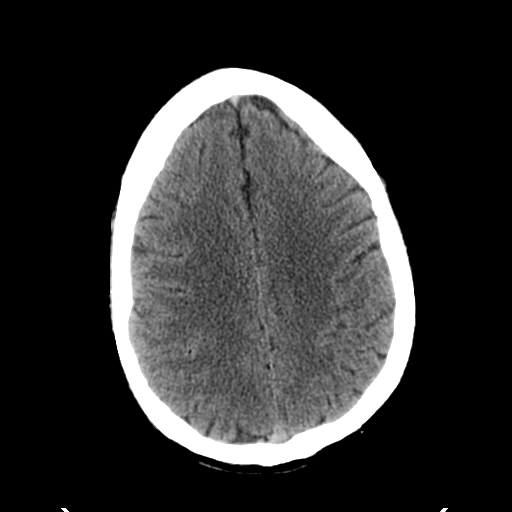
[im 27/36  brain]
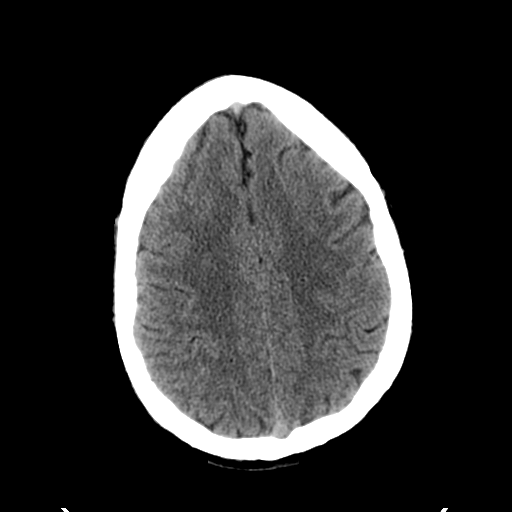
[im 27/36  bone]
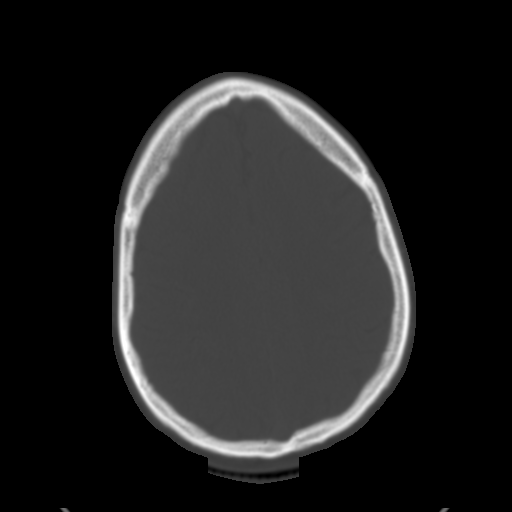
[im 29/36  brain]
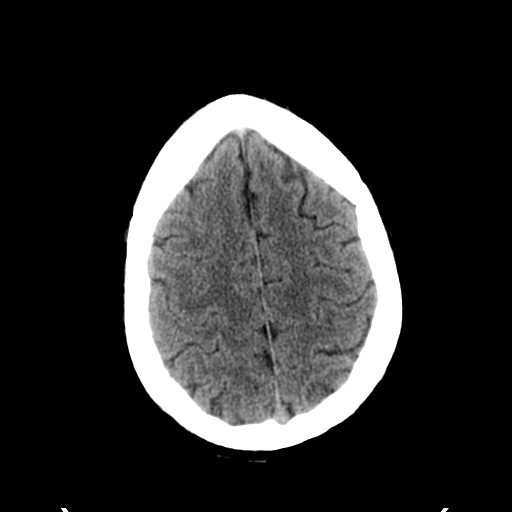
[im 32/36  brain]
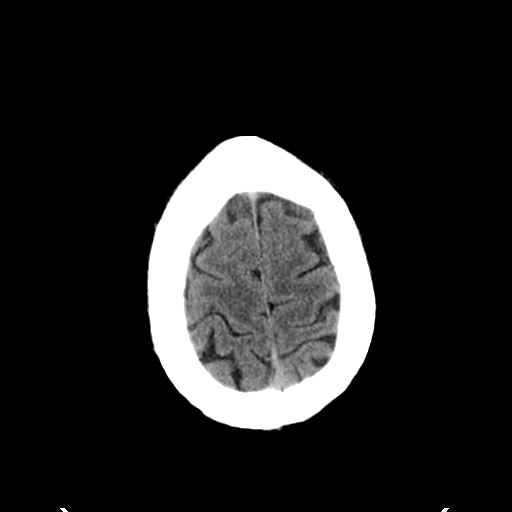
[im 34/36  brain]
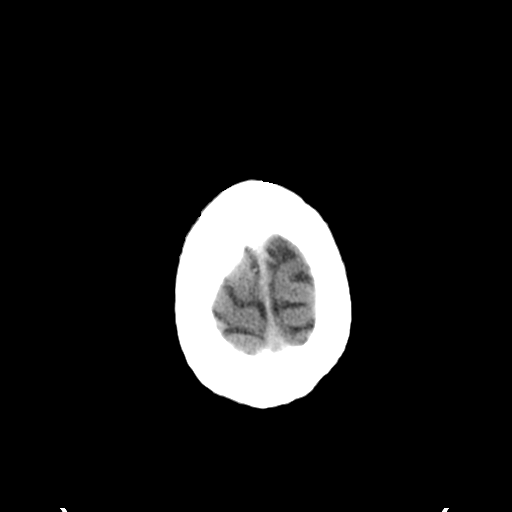

[16 of 30 positions shown; findings below may reference images not displayed]

FINDINGS: Area of old infarct and encephalomalacia noted in the
right frontal lobe, unchanged. No acute intracranial abnormality.
Specifically, no hemorrhage, hydrocephalus, mass lesion, acute
infarction, or significant intracranial injury.  No acute calvarial
abnormality.

Extensive chronic sinusitis changes throughout the paranasal
sinuses.
IMPRESSION: No acute intracranial abnormality.

Stable encephalomalacia in the right frontal lobe.

Chronic sinusitis.

## 2013-03-23 ENCOUNTER — Emergency Department (HOSPITAL_COMMUNITY)
Admission: EM | Admit: 2013-03-23 | Discharge: 2013-03-23 | Disposition: A | Discharge status: Home / Self Care | Payer: Medicaid Other | Attending: Emergency Medicine | Admitting: Emergency Medicine

## 2013-03-23 ENCOUNTER — Encounter (HOSPITAL_COMMUNITY): Payer: Self-pay | Admitting: Emergency Medicine

## 2013-03-23 DIAGNOSIS — R45851 Suicidal ideations: Secondary | ICD-10-CM | POA: Insufficient documentation

## 2013-03-23 DIAGNOSIS — F329 Major depressive disorder, single episode, unspecified: Secondary | ICD-10-CM

## 2013-03-23 DIAGNOSIS — F3289 Other specified depressive episodes: Secondary | ICD-10-CM | POA: Insufficient documentation

## 2013-03-23 DIAGNOSIS — Z87891 Personal history of nicotine dependence: Secondary | ICD-10-CM | POA: Insufficient documentation

## 2013-03-23 DIAGNOSIS — F332 Major depressive disorder, recurrent severe without psychotic features: Secondary | ICD-10-CM | POA: Diagnosis present

## 2013-03-23 DIAGNOSIS — Z8781 Personal history of (healed) traumatic fracture: Secondary | ICD-10-CM | POA: Insufficient documentation

## 2013-03-23 LAB — RAPID URINE DRUG SCREEN, HOSP PERFORMED
Amphetamines: NOT DETECTED
Benzodiazepines: NOT DETECTED
Cocaine: NOT DETECTED
Opiates: NOT DETECTED
Tetrahydrocannabinol: POSITIVE — AB

## 2013-03-23 LAB — CBC
MCH: 32.7 pg (ref 26.0–34.0)
MCHC: 35.3 g/dL (ref 30.0–36.0)
MCV: 92.6 fL (ref 78.0–100.0)
Platelets: 300 10*3/uL (ref 150–400)
RDW: 12.4 % (ref 11.5–15.5)

## 2013-03-23 LAB — COMPREHENSIVE METABOLIC PANEL
AST: 17 U/L (ref 0–37)
Albumin: 4 g/dL (ref 3.5–5.2)
Calcium: 10 mg/dL (ref 8.4–10.5)
Creatinine, Ser: 0.65 mg/dL (ref 0.50–1.35)
GFR calc non Af Amer: >90 mL/min (ref >90)
Sodium: 140 mEq/L (ref 135–145)
Total Protein: 7.1 g/dL (ref 6.0–8.3)

## 2013-03-23 MED ORDER — ZOLPIDEM TARTRATE 5 MG PO TABS: 5.0000 mg | ORAL_TABLET | Freq: Every evening | ORAL | Status: DC | PRN

## 2013-03-23 MED ORDER — LORAZEPAM 1 MG PO TABS
1.0000 mg | ORAL_TABLET | Freq: Three times a day (TID) | ORAL | Status: DC | PRN
  Administered 2013-03-23: 1 mg via ORAL
  Filled 2013-03-23: qty 1

## 2013-03-23 MED ORDER — NICOTINE 21 MG/24HR TD PT24: 21.0000 mg | MEDICATED_PATCH | Freq: Every day | TRANSDERMAL | Status: DC

## 2013-03-23 MED ORDER — ALUM & MAG HYDROXIDE-SIMETH 200-200-20 MG/5ML PO SUSP: 30.0000 mL | ORAL | Status: DC | PRN

## 2013-03-23 MED ORDER — ONDANSETRON HCL 4 MG PO TABS: 4.0000 mg | ORAL_TABLET | Freq: Three times a day (TID) | ORAL | Status: DC | PRN

## 2013-03-23 MED ORDER — ACETAMINOPHEN 325 MG PO TABS
650.0000 mg | ORAL_TABLET | ORAL | Status: DC | PRN
Start: 2013-03-23 — End: 2013-03-23

## 2013-03-23 MED ORDER — IBUPROFEN 200 MG PO TABS: 600.0000 mg | ORAL_TABLET | Freq: Three times a day (TID) | ORAL | Status: DC | PRN

## 2013-03-23 NOTE — ED Notes (Signed)
Pt states he has been very depressed since his mother passes away last week and it has gotten worse over the past 3 days  Pt states he has been having suicidal ideations  No plan

## 2013-03-23 NOTE — Consult Note (Signed)
Note reviewed and agreed with  
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
## 2013-03-23 NOTE — BHH Counselor (Addendum)
TC to [facility] [phone]. Maria at Urgent Care states pt's PCP is Dr. Leatrice Jewels at Urgent Care. Pt's last appt was 12/29/12 when [facility] gave pt script with 3 refills for oxycodone 20 mg x 4 daily.  Evette Cristal, [place] Assessment Counselor   TC to [facility] at [place]. [phone]. Pharmacist states pt has a script for oxycodone 20 mg x 4 daily. Pt last had oxycodone refilled on 10/1. Writer notified Shuvon Rankin NP.  Evette Cristal, [place] Assessment Counselor

## 2013-03-23 NOTE — ED Provider Notes (Signed)
Medical screening examination/treatment/procedure(s) were performed by non-physician practitioner and as supervising physician I was immediately available for consultation/collaboration.    Olivia Mackie, MD 03/23/13 [phone]

## 2013-03-23 NOTE — BHH Counselor (Signed)
Writer provided outpatient resources for pt including the following:  [address] [place], [place] [address] [phone] phone  [phone] fax  [facility]   Counseling and Education Center Calendar  Grief: What to Expect and What Can Help - January 2015  A one-hour information session for adults who are newly bereaved to better understand and cope with grief. Monday, January 12, 6 - 7 p.m. or Friday, January 16, noon - 1 p.m. For more information about this event, contact the Counseling and Education Center at [phone] or [email or web address]. Registration is required for all grief programs.  Handling the Holidays After a Loss - November 2014  An informational seminar for adults focusing on grief and the holidays. Monday, November 10, 6 - 7 p.m. or Thursday, November 13, 2 - 3 p.m. For more information about this event, contact the Counseling and Education Center at [phone] or [email or web address]. Registration is required for all grief programs.   Finding Our New Normal: Helping Bereaved Spouses Reinvest in Life (daytime) - October 2014  A six-week support group designed for those who have lost a beloved spouse or life partner and have moved through the early stages of grief and adjustment. Call for dates and times. For more information about these events, contact the Counseling and Education Center at [phone] or [email or web address]. Registration is required for all grief programs.   [facility]  [facility] ACCESS LINE:  [phone]  or [phone] [facility] [address] [place], [place] [address]  Psychiatrists [facility]   [facility] [address]    [address] [place], [place] [address]    [place], [place] [address] [phone]      [phone]  Dr. Archer Asa     [facility] [address]    [address] [place] [address]    [place] [address] [phone]      [phone]  Therapists [facility]    [facility] [address]   [address] [place], [place] [phone]      [phone]  [facility] Counseling [address]     [address] [place] [address]    [place], [place] [phone]      [phone]   Mobile Crisis Teams [facility]    Assertive  Mobile Crisis Care Unit    Psychotherapeutic Services [phone]     [address] [place], [place]        [phone]   Evette Cristal, [place] Assessment Counselor

## 2013-03-23 NOTE — ED Notes (Addendum)
Patient presents stating that he is in awful pain rating it a 10 out of 10; stating that he doesn't take tylenol and that he gets his pain medications from a reputable doctor and that we were able to look up everything else so we should be able to look that up too. Spoke with Dr. Norlene Campbell who discovered that the patient has been prescribed 3 different opiates (tramadol, vicodin & oxycodone ) by three different doctors but his UDS is negative for opiates. Patient states that he is having feelings of hopelessness but denies active suicidality with plan; patient denies homicidal thoughts and auditory or visual hallucinations.

## 2013-03-23 NOTE — ED Provider Notes (Signed)
11:28 AM Psychiatry re-evaluated and felt patient was safe for discharge and outpatient treatment.  On my eval, pt denied SI.  Stated he felt better.  Has family support.  Comfortable with outpatient treatment plan.  Clinical Impression: 1. Depression   2. MDD (major depressive disorder), recurrent severe, without psychosis       Candyce Churn, MD 03/23/13 1129

## 2013-03-23 NOTE — Consult Note (Signed)
  Subjective:  Patient states that he has had depression for a while but has been better.  Depression worsened after the death of his mother last month.  Patient states that his mother had cancer but wasn't expected to die.  States that he wasn't able to spend time with her and wasn't around as much as he would have been if death was expected. Patient states that he is not suicidal.  Does have a history of suicide thoughts "a long time ago nothing recent."  Patient is interested in out patient resources for therapy.  Psychiatric Specialty Exam: Physical Exam  ROS  Blood pressure 116/71, pulse 60, temperature 97.8 F (36.6 C), temperature source Oral, resp. rate 18, SpO2 100.00%.There is no height or weight on file to calculate BMI.  General Appearance: Casual and Fairly Groomed  Eye Contact::  Good  Speech:  Clear and Coherent and Normal Rate  Volume:  Normal  Mood:  Depressed  Affect:  Depressed  Thought Process:  Circumstantial and Goal Directed  Orientation:  Full (Time, Place, and Person)  Thought Content:  Rumination  Suicidal Thoughts:  No  Homicidal Thoughts:  No  Memory:  Immediate;   Good Recent;   Good Remote;   Good  Judgement:  Fair  Insight:  Fair and Present  Psychomotor Activity:  Normal  Concentration:  Good  Recall:  Good  Akathisia:  No  Handed:  Right  AIMS (if indicated):     Assets:  Communication Skills Desire for Improvement Housing Social Support Transportation  Sleep:      Face to face interview and consult with Dr. Ladona Ridgel   Disposition:  Discharge home with resource information and contact for therapy ([facility])  Kenetha Cozza B. Marylynn Rigdon FNP-BC Family Nurse Practitioner, Board Certified
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
## 2013-06-14 ENCOUNTER — Emergency Department (HOSPITAL_COMMUNITY)
Admission: EM | Admit: 2013-06-14 | Discharge: 2013-06-14 | Disposition: A | Discharge status: Home / Self Care | Payer: Medicaid Other | Attending: Emergency Medicine | Admitting: Emergency Medicine

## 2013-06-14 ENCOUNTER — Encounter (HOSPITAL_COMMUNITY): Payer: Self-pay | Admitting: Emergency Medicine

## 2013-06-14 DIAGNOSIS — R45851 Suicidal ideations: Secondary | ICD-10-CM | POA: Insufficient documentation

## 2013-06-14 DIAGNOSIS — F17200 Nicotine dependence, unspecified, uncomplicated: Secondary | ICD-10-CM | POA: Insufficient documentation

## 2013-06-14 DIAGNOSIS — F332 Major depressive disorder, recurrent severe without psychotic features: Secondary | ICD-10-CM | POA: Insufficient documentation

## 2013-06-14 DIAGNOSIS — F331 Major depressive disorder, recurrent, moderate: Secondary | ICD-10-CM

## 2013-06-14 DIAGNOSIS — Z8781 Personal history of (healed) traumatic fracture: Secondary | ICD-10-CM | POA: Insufficient documentation

## 2013-06-14 DIAGNOSIS — Z79899 Other long term (current) drug therapy: Secondary | ICD-10-CM | POA: Insufficient documentation

## 2013-06-14 DIAGNOSIS — F458 Other somatoform disorders: Secondary | ICD-10-CM | POA: Insufficient documentation

## 2013-06-14 DIAGNOSIS — Z87828 Personal history of other (healed) physical injury and trauma: Secondary | ICD-10-CM | POA: Insufficient documentation

## 2013-06-14 HISTORY — DX: Unspecified injury of head, initial encounter: S09.90XA

## 2013-06-14 LAB — CBC WITH DIFFERENTIAL/PLATELET
BASOS PCT: 0 % (ref 0–1)
Basophils Absolute: 0 10*3/uL (ref 0.0–0.1)
EOS ABS: 0.3 10*3/uL (ref 0.0–0.7)
Eosinophils Relative: 2 % (ref 0–5)
HCT: 41.4 % (ref 39.0–52.0)
Hemoglobin: 15.1 g/dL (ref 13.0–17.0)
Lymphocytes Relative: 26 % (ref 12–46)
Lymphs Abs: 3.8 10*3/uL (ref 0.7–4.0)
MCH: 33.1 pg (ref 26.0–34.0)
MCHC: 36.5 g/dL — AB (ref 30.0–36.0)
MCV: 90.8 fL (ref 78.0–100.0)
Monocytes Absolute: 0.8 10*3/uL (ref 0.1–1.0)
Monocytes Relative: 6 % (ref 3–12)
Neutro Abs: 9.5 10*3/uL — ABNORMAL HIGH (ref 1.7–7.7)
Neutrophils Relative %: 66 % (ref 43–77)
PLATELETS: 347 10*3/uL (ref 150–400)
RBC: 4.56 MIL/uL (ref 4.22–5.81)
RDW: 12.4 % (ref 11.5–15.5)
WBC: 14.4 10*3/uL — ABNORMAL HIGH (ref 4.0–10.5)

## 2013-06-14 LAB — POCT I-STAT, CHEM 8
BUN: 14 mg/dL (ref 6–23)
CHLORIDE: 98 meq/L (ref 96–112)
CREATININE: 0.9 mg/dL (ref 0.50–1.35)
Calcium, Ion: 1.26 mmol/L — ABNORMAL HIGH (ref 1.12–1.23)
GLUCOSE: 94 mg/dL (ref 70–99)
HEMATOCRIT: 48 % (ref 39.0–52.0)
Hemoglobin: 16.3 g/dL (ref 13.0–17.0)
POTASSIUM: 4.1 meq/L (ref 3.7–5.3)
Sodium: 139 mEq/L (ref 137–147)
TCO2: 30 mmol/L (ref 0–100)

## 2013-06-14 LAB — RAPID URINE DRUG SCREEN, HOSP PERFORMED
Amphetamines: NOT DETECTED
Barbiturates: NOT DETECTED
Benzodiazepines: NOT DETECTED
Cocaine: NOT DETECTED
OPIATES: NOT DETECTED
Tetrahydrocannabinol: NOT DETECTED

## 2013-06-14 LAB — ETHANOL: Alcohol, Ethyl (B): <11 mg/dL (ref 0–11)

## 2013-06-14 MED ORDER — LORAZEPAM 1 MG PO TABS: 1.0000 mg | ORAL_TABLET | Freq: Three times a day (TID) | ORAL | Status: DC | PRN

## 2013-06-14 MED ORDER — NICOTINE 21 MG/24HR TD PT24: 21.0000 mg | MEDICATED_PATCH | Freq: Every day | TRANSDERMAL | Status: DC

## 2013-06-14 MED ORDER — ONDANSETRON HCL 4 MG PO TABS: 4.0000 mg | ORAL_TABLET | Freq: Three times a day (TID) | ORAL | Status: DC | PRN

## 2013-06-14 NOTE — Consult Note (Signed)
  Review of Systems  HENT: Negative.   Eyes: Negative.   Respiratory: Negative.   Cardiovascular: Negative.   Gastrointestinal: Negative.   Genitourinary: Negative.   Musculoskeletal: Negative.   Skin: Negative.   Neurological: Negative.   Endo/Heme/Allergies: Negative.   Psychiatric/Behavioral: Positive for depression.   [name] is interested in being discharged as quickly as he can be and did not indicate any medical or physical concerns
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
## 2013-06-14 NOTE — ED Provider Notes (Signed)
Medical screening examination/treatment/procedure(s) were performed by non-physician practitioner and as supervising physician I was immediately available for consultation/collaboration.  EKG Interpretation   None         Dagmar HaitWilliam Cathern Tahir, MD 06/14/13 [phone]

## 2013-06-14 NOTE — BH Assessment (Signed)
D/C home per Dr.Taylor and Assunta FoundShuvon Rankin, NP. Pt will follow up with [facility] and contact information to facility was provided.
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
## 2013-08-22 ENCOUNTER — Encounter (HOSPITAL_COMMUNITY): Payer: Self-pay | Admitting: Emergency Medicine

## 2013-08-22 ENCOUNTER — Emergency Department (HOSPITAL_COMMUNITY)
Admission: EM | Admit: 2013-08-22 | Discharge: 2013-08-22 | Disposition: A | Discharge status: Home / Self Care | Payer: Medicaid Other | Attending: Emergency Medicine | Admitting: Emergency Medicine

## 2013-08-22 DIAGNOSIS — Z8781 Personal history of (healed) traumatic fracture: Secondary | ICD-10-CM | POA: Insufficient documentation

## 2013-08-22 DIAGNOSIS — Z87828 Personal history of other (healed) physical injury and trauma: Secondary | ICD-10-CM | POA: Insufficient documentation

## 2013-08-22 DIAGNOSIS — F17200 Nicotine dependence, unspecified, uncomplicated: Secondary | ICD-10-CM | POA: Insufficient documentation

## 2013-08-22 DIAGNOSIS — M25569 Pain in unspecified knee: Secondary | ICD-10-CM | POA: Insufficient documentation

## 2013-08-22 DIAGNOSIS — Z79899 Other long term (current) drug therapy: Secondary | ICD-10-CM | POA: Insufficient documentation

## 2013-08-22 DIAGNOSIS — G8929 Other chronic pain: Secondary | ICD-10-CM | POA: Insufficient documentation

## 2013-08-22 DIAGNOSIS — M25559 Pain in unspecified hip: Secondary | ICD-10-CM | POA: Insufficient documentation

## 2013-08-22 NOTE — Discharge Instructions (Signed)
Read the information below.  You may return to the Emergency Department at any time for worsening condition or any new symptoms that concern you.  Chronic Pain Discharge Instructions  Emergency care providers appreciate that many patients coming to us are in severe pain and we wish to address their pain in the safest, most responsible manner.  It is important to recognize however, that the proper treatment of chronic pain differs from that of the pain of injuries and acute illnesses.  Our goal is to provide quality, safe, personalized care and we thank you for giving us the opportunity to serve you. The use of narcotics and related agents for chronic pain syndromes may lead to additional physical and psychological problems.  Nearly as many people die from prescription narcotics each year as die from car crashes.  Additionally, this risk is increased if such prescriptions are obtained from a variety of sources.  Therefore, only your primary care physician or a pain management specialist is able to safely treat such syndromes with narcotic medications long-term.    Documentation revealing such prescriptions have been sought from multiple sources may prohibit us from providing a refill or different narcotic medication.  Your name may be checked first through the [facility] Controlled Substances Reporting System.  This database is a record of controlled substance medication prescriptions that the patient has received.  This has been established by [facility] in an effort to eliminate the dangerous, and often life threatening, practice of obtaining multiple prescriptions from different medical providers.   If you have a chronic pain syndrome (i.e. chronic headaches, recurrent back or neck pain, dental pain, abdominal or pelvis pain without a specific diagnosis, or neuropathic pain such as fibromyalgia) or recurrent visits for the same condition without an acute diagnosis, you may be treated with  non-narcotics and other non-addictive medicines.  Allergic reactions or negative side effects that may be reported by a patient to such medications will not typically lead to the use of a narcotic analgesic or other controlled substance as an alternative.   Patients managing chronic pain with a personal physician should have provisions in place for breakthrough pain.  If you are in crisis, you should call your physician.  If your physician directs you to the emergency department, please have the doctor call and speak to our attending physician concerning your care.   When patients come to the Emergency Department (ED) with acute medical conditions in which the Emergency Department physician feels appropriate to prescribe narcotic or sedating pain medication, the physician will prescribe these in very limited quantities.  The amount of these medications will last only until you can see your primary care physician in his/her office.  Any patient who returns to the ED seeking refills should expect only non-narcotic pain medications.   In the event of an acute medical condition exists and the emergency physician feels it is necessary that the patient be given a narcotic or sedating medication -  a responsible adult driver should be present in the room prior to the medication being given by the nurse.   Prescriptions for narcotic or sedating medications that have been lost, stolen or expired will not be refilled in the Emergency Department.    Patients who have chronic pain may receive non-narcotic prescriptions until seen by their primary care physician.  It is every patients personal responsibility to maintain active prescriptions with his or her primary care physician or specialist.    Behavioral Health Resources in the [facility]  Intensive Outpatient Programs: [facility]      [address] [place], [place] [phone] Both a day and evening program       [facility] Outpatient     [address]        [place], [place] [address] [phone]         ADS: Alcohol & Drug Svcs [address] [place] [phone]  [facility] Mental Health ACCESS LINE: [phone] or [phone] [address] [place], [place] [address] [email or web address]  Mobile Crisis Teams:                                        Therapeutic Alternatives         Mobile Crisis Care Unit [phone]             Assertive Psychotherapeutic Services [address] Ginette Otto [phone]                                         Interventionist [address] DeEsch [address] [place] [phone]  Self-Help/Support Groups: Mental Health Assoc. of [employer] of support groups [phone] (call for more info)  Narcotics Anonymous (NA) Caring Services [address] [place] - 2 meetings at this location  Residential Treatment Programs:  [facility]      [address]        [place]       [phone]         [facility] [address] [place] [address]06 [place], [place]  [address] [phone]  [facility]  [address] [place], [place] [address] [phone] Admissions: 8am-3pm M-F  [facility]     [address]        [place], [place] [address]       [phone]         [facility] [address] Starling Manns [place], [place] [phone]  [facility] [address] [place], [place] [phone]  [facility] - Intensive Outpatient      [address]     [place], [place]       [phone]         [facility] (Addiction Recovery Care [facility]oc.)     [address] [place], [place] [phone] or [phone]  Residential Treatment Services (RTS)  [address] [place], [place] [phone]  [facility] [address]                                                [address] [place] [phone]  [facility] Resources: [phone]               General Therapy                                                Angie Fava, PhD        [phone]  [place], [place] [address]         [phone]   Insurance  [facility] Behavioral   [address] [place], [place] [address] [phone]  [facility] [address] [place], [place] [address] [phone] Insurance/Medicaid/sponsorship through [facility] and Families                                              [address]                                        [place], [place] [address]    Therapy/tele-psych/case         [phone]          [facility] [address], [place]  [address]  Adolescent/group home/case management [phone]                                           Creola Corn PhD       General therapy       Insurance   [phone]         Dr. Lolly Mustache Insurance [phone] M-F  [place] Detox/Residential Medicaid, sponsorship [phone]     Emergency Department Resource Guide 1) Find a Doctor and Pay Out of Pocket Although you won't have to find out who is covered by your insurance plan, it is a good idea to ask around and get recommendations. You will then need to call the office and see if the doctor you have chosen will accept you as a new patient and what types of options they offer for patients who are self-pay. Some doctors offer discounts or will set up payment plans for their patients who do not have insurance, but you will need to ask so you aren't surprised when you get to your appointment.  2) Contact Your Local Health Department Not all health departments have doctors that can see patients for sick visits, but many do, so it is worth a call to see if yours does. If you don't know where your local health department is, you can check  in your phone book. The CDC also has a tool to help you locate your state's health department, and many state websites also have listings of all of their local health departments.  3) Find a Walk-in Clinic If your illness is not likely to be very severe or complicated, you may want to try a walk in clinic. These are popping up all over the country in pharmacies, drugstores, and shopping centers. They're usually staffed by nurse practitioners or physician assistants that have been trained to treat common illnesses and complaints. They're usually fairly quick and inexpensive. However, if you have serious medical issues or chronic medical problems, these are probably not your best option.  No Primary Care Doctor: - Call Health Connect at  [phone] - they can help you locate a primary care doctor that  accepts your insurance,  provides certain services, etc. - Physician Referral Service- [phone]  Chronic Pain Problems: Organization         Address  Phone   Notes  [facility]  [phone] Patients need to be referred by their primary care doctor.   Medication Assistance: Organization         Address  Phone   Notes  [facility] [address] Medication [facility] [address] [place] [place], [place] [address] [phone] --Must be a resident of [facility] -- Must have NO insurance coverage whatsoever (no Medicaid/ Medicare, etc.) -- The pt. MUST have a primary care doctor that directs their care regularly and follows them in the community   MedAssist  [phone]   [employer]  [phone]    Agencies that provide inexpensive medical care: Organization         Address  Phone   Notes  [facility]  [phone]   [facility]    [phone]   [facility] [address] [place], [place] [address] [phone]   [facility] [address] [place], [place] [phone]    Planned Parenthood    [phone]   [facility]    [phone]   [facility]  [address], [place] Phone:  [phone], Fax:  [phone] Hours of Operation:  9 am - 6 pm, M-F.  Also accepts Medicaid/Medicare and self-pay.  [facility]  [address], [place] Phone: [phone], Fax: [phone]. Hours of Operation:  8:30 am - 5:30 pm, M-F.  Also accepts Medicaid and self-pay.  [facility] [place] [address] [place] Phone: [phone]   [facility] [address] Natasha Bence [place], [place] [phone], Ext. [phone] Mondays & Thursdays: 7-9 AM.  First 15 patients are seen on a first come, first serve basis.    Medicaid-accepting [facility] Providers:  Organization         Address  Phone   Notes  [facility] [address], [place] [phone] Also accepts self-pay patients.  [facility] [address] Laurell Josephs [place], [place]  [phone]   [facility] [address], [place] [phone]   [facility] [address] [place] [phone]   Renaye Rakers [address], [place]   [phone] Only accepts [place] Access [place] patients after they have their name applied to their card.   Self-Pay (no insurance) in [place]:  Organization         Address  Phone   Notes  Sickle Cell Patients, [facility] Internal Medicine [address] [place], [place] [phone]   [facility] [address] [place], [place] [phone]   [facility] [place]  [address], [place] [phone]   [facility]/Dr. Osei-Bonsu  [address], [place] Chery [place] or [address], [place] [phone] Phone number for both [place] and [place] locations is the  same.  Urgent Medical and [facility] [address] [facility] [phone]   [facility] [address] [place] or [address] [phone]   [facility] [address], [place] [phone]) [phone], phone; [phone], fax Sees patients 1st and 3rd Saturday of every month.  Must not qualify for public or private insurance (i.e. Medicaid, Medicare, [place] Health Choice, Veterans' Benefits)  Household income should be no more than 200% of the poverty level The clinic cannot treat you if you are pregnant or think you are pregnant  Sexually transmitted diseases are not treated at the clinic.    Dental Care: Organization         Address  Phone  Notes  [facility] [address], [place] [phone]) [phone] Accepts children up to age 31 who are enrolled in [place]icaid or [place] Health Choice; pregnant women with a Medicaid card; and children who have applied for Medicaid or [place] Health Choice, but were declined, whose parents can pay a reduced fee at time of service.  [facility] [place]  [address], [place] [phone]) [phone] Accepts children up to age 31 who are enrolled in [place]icaid or [place] Health Choice; pregnant women with a Medicaid card; and children who have applied for Medicaid or  Health Choice, but were declined, whose parents can pay a reduced fee at time of service.  [facility]  [address], [place] [phone]) [phone] Patients are seen by appointment only. Walk-ins are not accepted. [facility] will see patients 218 years of age and older. Monday - Tuesday (8am-5pm) Most Wednesdays (8:30-5pm) $30 per visit, cash only  [facility]  [address], [facility] [place] [phone]) [phone] Patients are seen by appointment only. Walk-ins are not accepted. [facility] will see patients  31 years of age and older. One Wednesday Evening (Monthly: Volunteer Based).  $30 per visit, cash only  [employer] [facility]  [phone] for adults; Children under age 824, call Graduate Pediatric Dentistry at [phone]. Children aged 434-14, please call [phone] to request a pediatric application.  Dental services are provided in all areas of dental care including fillings, crowns and bridges, complete and partial dentures, implants, gum treatment, root canals, and extractions. Preventive care is also provided. Treatment is provided to both adults and children. Patients are selected via a lottery and there is often a waiting list.   [facility] [address], [place]  [phone]) [phone] [email or web address]   [facility] [address], [place], [place] [phone], Ext. [phone] Second and Fourth Thursday of each month, opens at 6:30 AM; Clinic ends at 9 AM.  Patients are seen on a first-come first-served basis, and a limited number are seen during each clinic.   [facility]  [address], [place], [place] [phone]) [phone]   Eligibility Requirements You must have lived in [place], [place], or [place] counties for at least the last three months.   You cannot be eligible for state or federal sponsored National Cityhealthcare insurance, including CIGNAVeterans Administration, [place]icaid, or [employer]icare.   You generally cannot be eligible for healthcare insurance through your employer.    How to apply: Eligibility screenings are held every Tuesday and Wednesday afternoon from 1:00 pm until 4:00 pm. You do not need an appointment for the interview!  [facility] [address], [place], [place] [phone]   [facility]  [phone]   [facility]  [phone]   [facility]  [phone]  Behavioral Health Resources in the Community: Intensive Outpatient  Programs Organization         Address  Phone  Notes  [facility] [address], [place], [place] [phone]   [facility] Outpatient [address], [place], [place] [phone]   ADS: Alcohol & Drug Svcs [address], [place], [place]  [phone]   [facility] [address],  [place], [place] [phone] or [phone]   Substance Abuse Resources Organization         Address  Phone  Notes  Alcohol and Drug Services  [phone]   [facility]  [phone]   [facility]  [phone]   [facility]  [phone]   Residential & Outpatient Substance Abuse Program  [phone]   Psychological Services Organization         Address  Phone  Notes  [facility]  [phone]- [phone]   [facility]  [phone]- [phone]   [facility] [address], [place] 701 659 [phone] or [phone]    Mobile Crisis Teams Organization         Address  Phone  Notes  Therapeutic Alternatives, Mobile Crisis Care Unit  [phone]   [facility]  [address] [place], [place] [phone]   Doristine LocksSharon DeEsch [address] [place] [phone]    Self-Help/Support Groups Organization         Address  Phone             Notes  Mental Health Assoc. of [place] - variety of support groups  336- [phone] Call for more information  Narcotics Anonymous (NA), Caring Services [address], [place]  2 meetings at this location   Statisticianesidential Treatment Programs Organization         Address  Phone  Notes  [facility] [address],    [place]  [phone]   [facility]  [address], [place] [phone], [place], [place] [phone]   [facility] [address], [place] [phone] Admissions: 8am-3pm M-F  [facility] [address]    [place], [place]  [phone]   [facility] [address], [place], [place] [phone]   [facility] [address]  [place], [place] [phone]   [facility] - Intensive Outpatient [address], [place], [place] [phone]   [facility] (Addiction Recovery Care Assoc[facility] [address]  [place], [place] [phone] or [phone]   Residential Treatment Services (RTS) [address] [place], [place] [phone] Accepts Medicaid  [facility] [address]  [place] [phone] Substance Abuse/Addiction Treatment   [facility] Resources Organization         Address  Phone  Notes  [facility]  [phone]   Angie FavaJulie Brannon, PhD [address], [address] [place], [place]   [phone]) [phone] or [phone]   [facility]   [address] [place], [place] [phone](336) [phone]   [facility] [address], [place], [place] [phone](336) [phone] Insurance/Medicaid/sponsorship through [employer] [facility]  Faith and Families [address]                                    [place], [place] [phone](336) [phone] Therapy/tele-psych/case  [facility] [address]  [place], [place] [phone]    Dr. Adele Schilder  [phone]   [facility] Dept. 1) [address] [place] 2) [place] 3)  [place] 65, [place] [phone]  [phone]   [place] [phone] or [phone] (After Hours)
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
## 2013-08-22 NOTE — ED Notes (Signed)
Patient advised that the urgent care that had been managing his chronic pain has referred him to a pain clinic.   He advised that he doesn't go to them until 04/22.  Patient asking for roxi and clonazepam.   Patient advised his pain was 10/10, but changed it to 7/10 after I explained the pain scale a little better.   Patient walked in and had no trouble.

## 2013-08-22 NOTE — ED Notes (Signed)
After PA saw patient, he left before discharge paperwork could be printed.   Patient was upset that nothing being prescribed.

## 2013-08-22 NOTE — ED Notes (Signed)
Was referred to pain clinic and cannot get into see them for a while and has run out of pain meds

## 2013-08-23 NOTE — ED Provider Notes (Signed)
Medical screening examination/treatment/procedure(s) were performed by non-physician practitioner and as supervising physician I was immediately available for consultation/collaboration.   EKG Interpretation None        Tavaria Mackins M Christabella Alvira, MD 08/23/13 0740 

## 2013-09-04 IMAGING — US US SCROTUM
1 series · 14 of 25 positions shown · non-contrast
Comparison: None.

CLINICAL DATA: Right-sided testicular pain and swelling

SCROTAL ULTRASOUND
DOPPLER ULTRASOUND OF THE TESTICLES
TECHNIQUE: Complete ultrasound examination of the testicles,
epididymis, and other scrotal structures was performed.  Color and
spectral Doppler ultrasound were also utilized to evaluate blood
flow to the testicles.

[Series 1: us scrotum · 0.08mm/px · 14 of 48 slices shown]
[im 1/48]
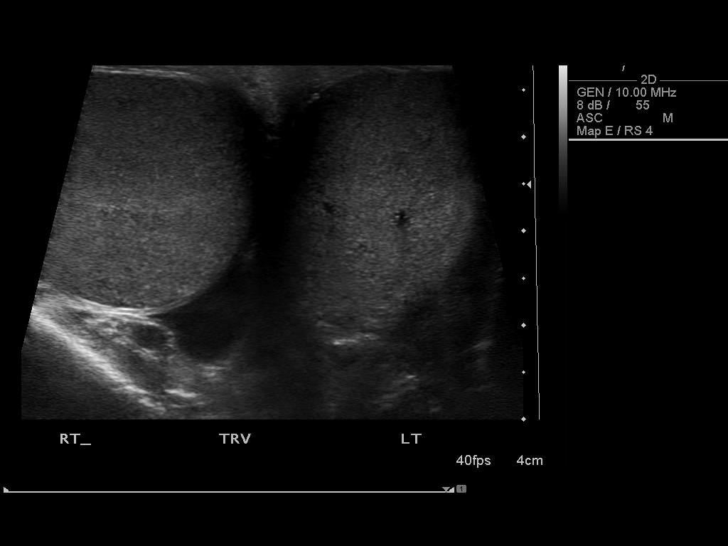
[im 4/48]
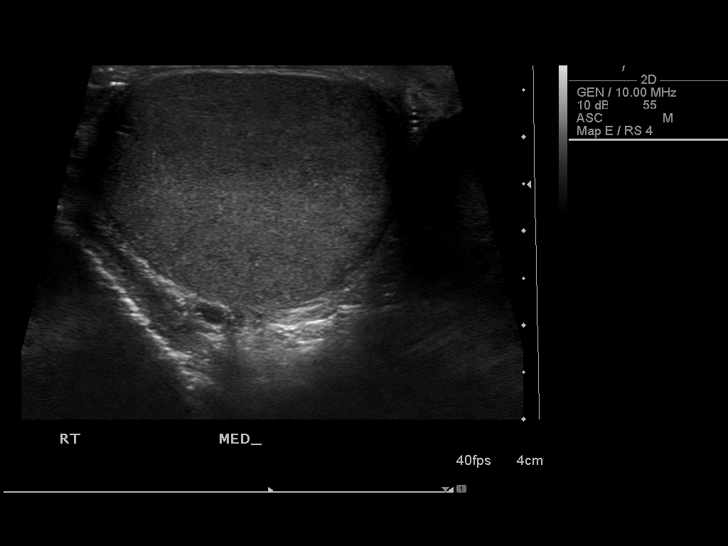
[im 8/48]
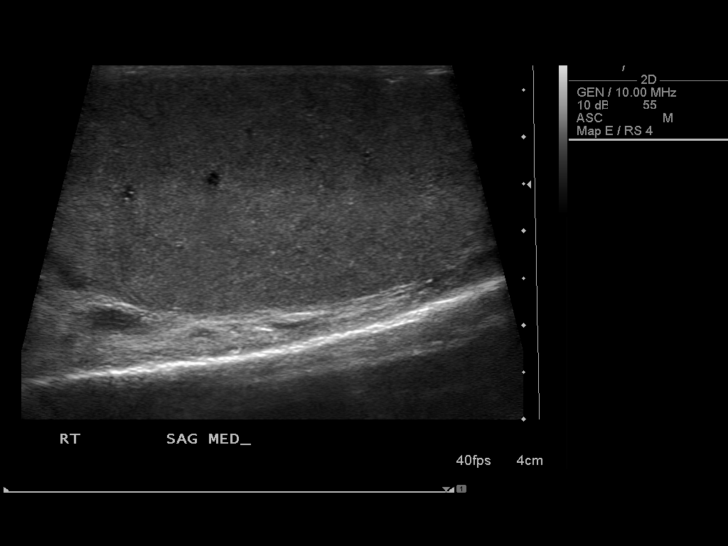
[im 12/48]
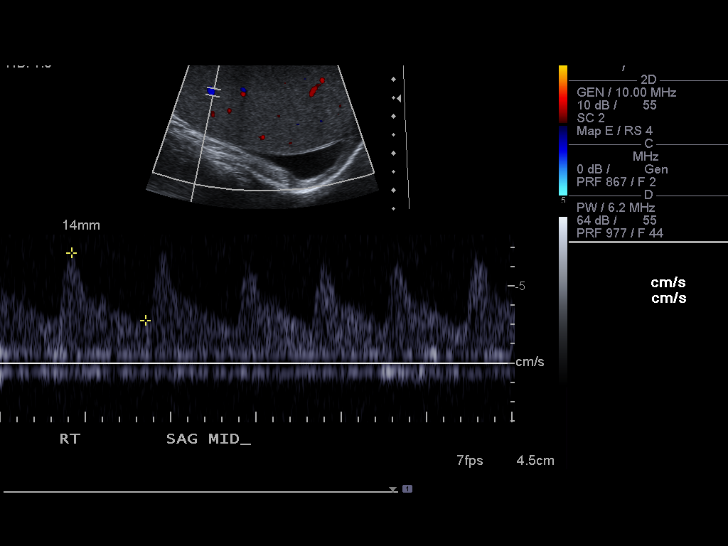
[im 16/48]
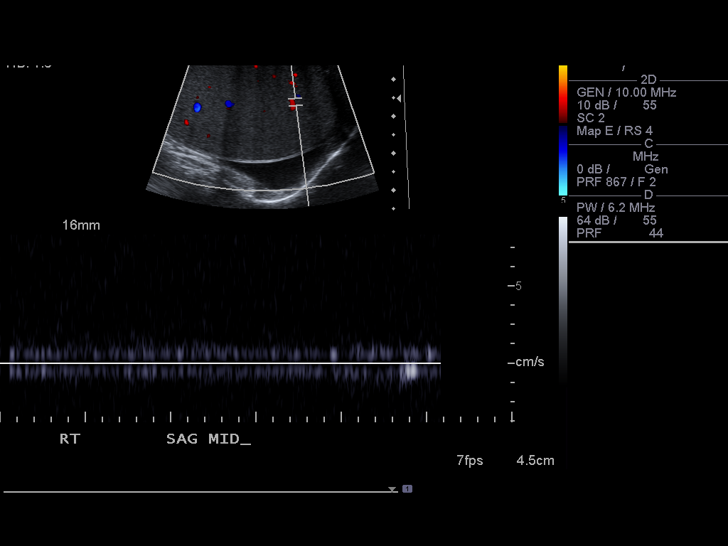
[im 18/48]
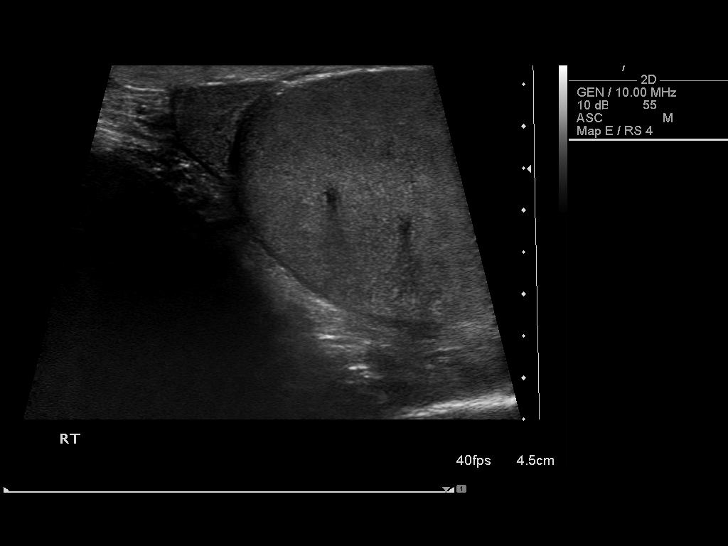
[im 22/48]
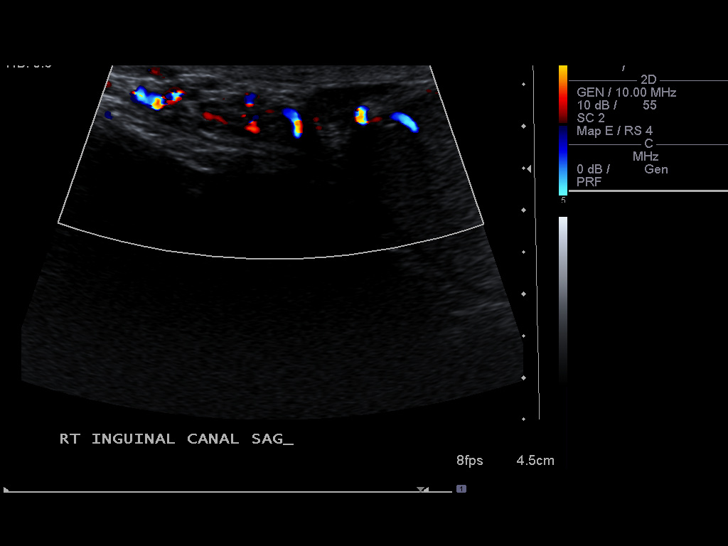
[im 26/48]
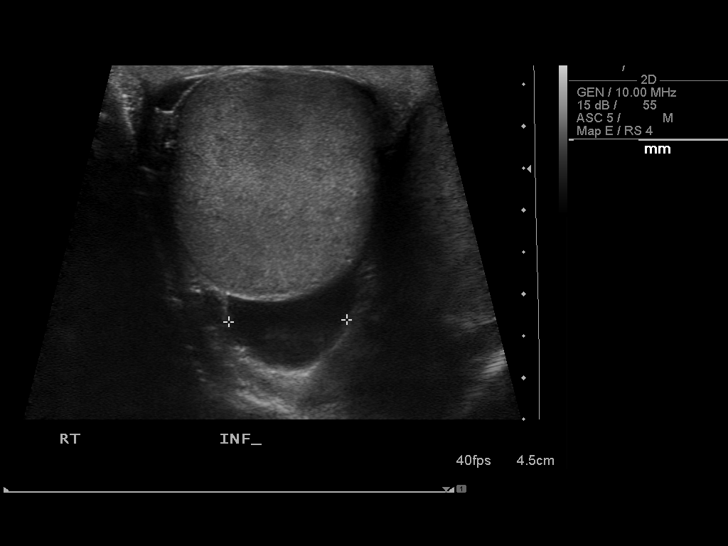
[im 30/48]
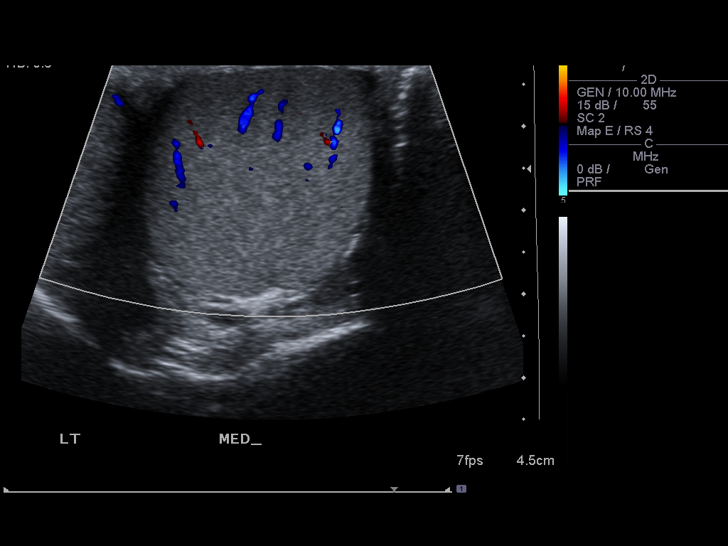
[im 32/48]
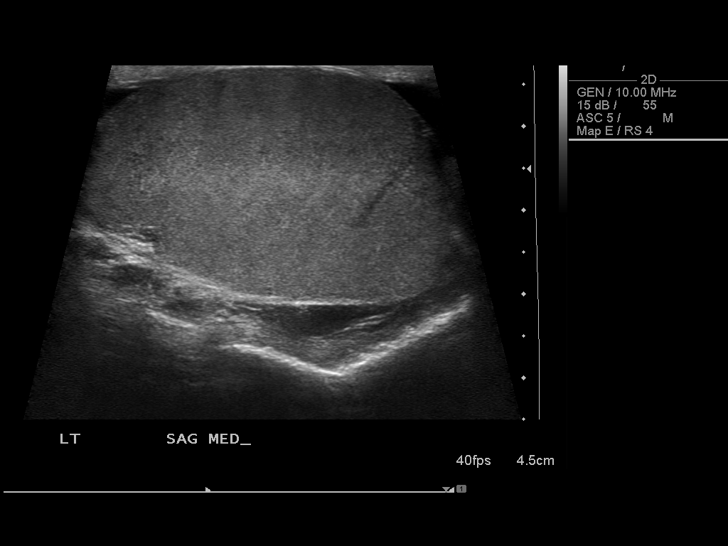
[im 36/48]
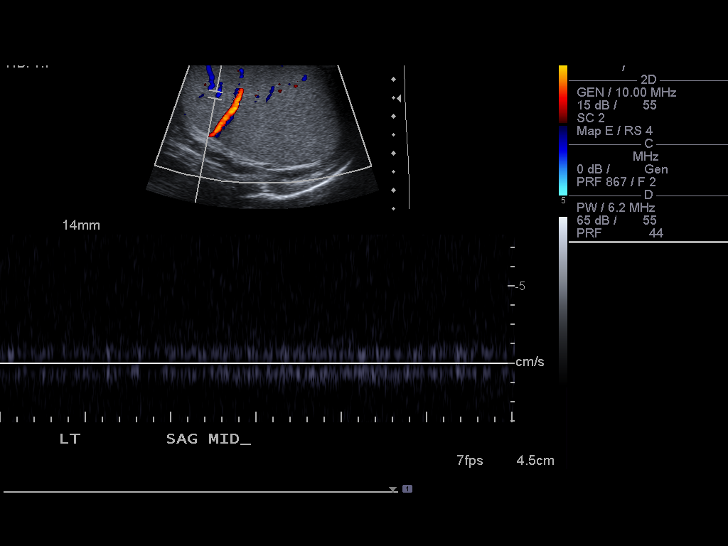
[im 40/48]
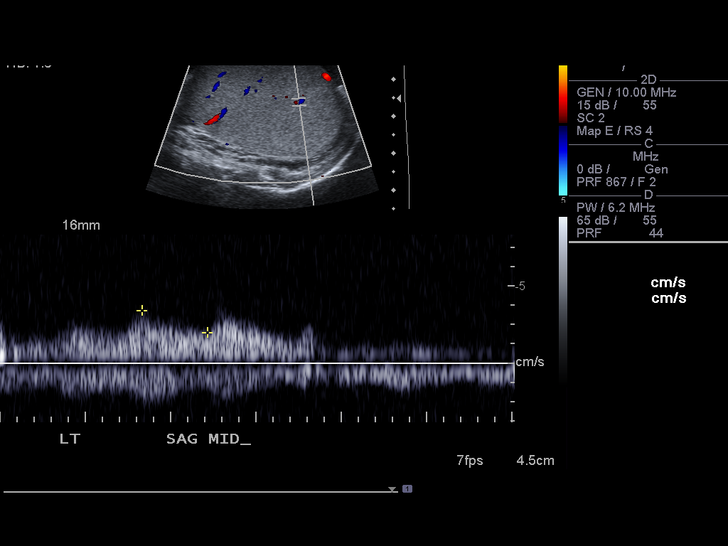
[im 44/48]
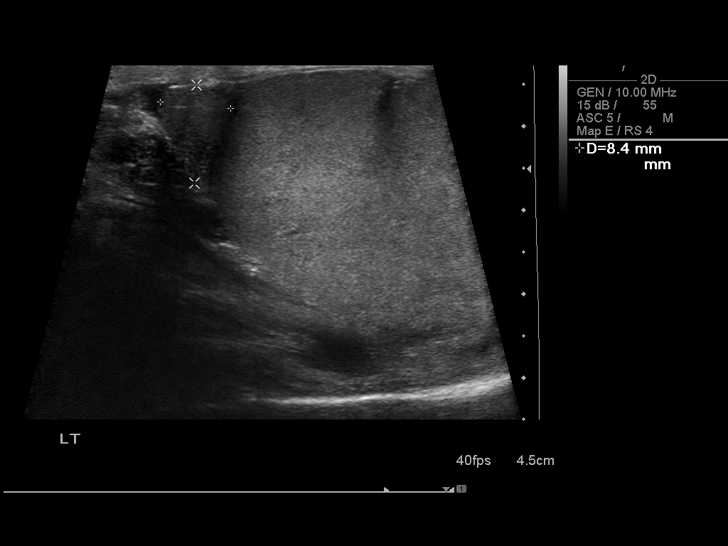
[im 48/48]
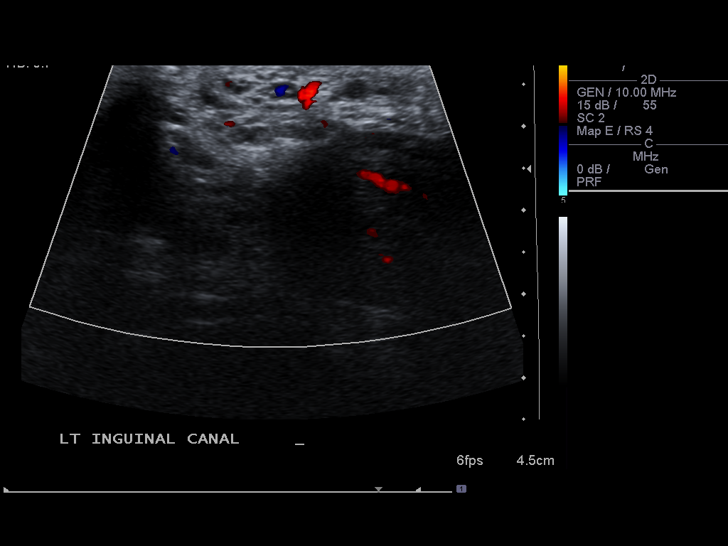

[14 of 25 positions shown; findings below may reference images not displayed]

FINDINGS: The testicles are symmetric in size and echogenicity.
The right testis measures 5 x 2.7 x 3.4 cm.  The left testis
measures 4.4 x 3 x 2.8 cm. No testicular masses are seen, and there
is no evidence of microlithiasis.  Both epididymal heads are
unremarkable in appearance.  There is a small right hydrocele
measuring about 2 x 0.7 cm. There is no evidence of left hydrocele,
varicocele, or other extra-testicular abnormality.

Blood flow is seen within both testicles on color Doppler
sonography.  Doppler spectral waveforms show both arterial and
venous flow signal in both testicles.
IMPRESSION: Small right hydrocele.  No evidence of testicular mass,
inflammation, or torsion.

## 2013-12-07 ENCOUNTER — Encounter (HOSPITAL_COMMUNITY): Payer: Self-pay | Admitting: Emergency Medicine

## 2013-12-07 ENCOUNTER — Emergency Department (HOSPITAL_COMMUNITY)
Admission: EM | Admit: 2013-12-07 | Discharge: 2013-12-07 | Disposition: A | Discharge status: Home / Self Care | Payer: Medicaid Other | Attending: Emergency Medicine | Admitting: Emergency Medicine

## 2013-12-07 DIAGNOSIS — S79929A Unspecified injury of unspecified thigh, initial encounter: Secondary | ICD-10-CM | POA: Diagnosis not present

## 2013-12-07 DIAGNOSIS — Z8781 Personal history of (healed) traumatic fracture: Secondary | ICD-10-CM | POA: Insufficient documentation

## 2013-12-07 DIAGNOSIS — Z79899 Other long term (current) drug therapy: Secondary | ICD-10-CM | POA: Insufficient documentation

## 2013-12-07 DIAGNOSIS — S79919A Unspecified injury of unspecified hip, initial encounter: Secondary | ICD-10-CM | POA: Insufficient documentation

## 2013-12-07 DIAGNOSIS — S335XXA Sprain of ligaments of lumbar spine, initial encounter: Secondary | ICD-10-CM | POA: Diagnosis not present

## 2013-12-07 DIAGNOSIS — F17200 Nicotine dependence, unspecified, uncomplicated: Secondary | ICD-10-CM | POA: Insufficient documentation

## 2013-12-07 DIAGNOSIS — IMO0002 Reserved for concepts with insufficient information to code with codable children: Secondary | ICD-10-CM | POA: Diagnosis present

## 2013-12-07 DIAGNOSIS — S39012A Strain of muscle, fascia and tendon of lower back, initial encounter: Secondary | ICD-10-CM

## 2013-12-07 DIAGNOSIS — M25552 Pain in left hip: Secondary | ICD-10-CM

## 2013-12-07 DIAGNOSIS — Y9389 Activity, other specified: Secondary | ICD-10-CM | POA: Diagnosis not present

## 2013-12-07 DIAGNOSIS — Y9289 Other specified places as the place of occurrence of the external cause: Secondary | ICD-10-CM | POA: Insufficient documentation

## 2013-12-07 DIAGNOSIS — X503XXA Overexertion from repetitive movements, initial encounter: Secondary | ICD-10-CM | POA: Insufficient documentation

## 2013-12-07 DIAGNOSIS — G8929 Other chronic pain: Secondary | ICD-10-CM | POA: Diagnosis not present

## 2013-12-07 DIAGNOSIS — X500XXA Overexertion from strenuous movement or load, initial encounter: Secondary | ICD-10-CM | POA: Diagnosis not present

## 2013-12-07 MED ORDER — NAPROXEN 500 MG PO TABS: 500.0000 mg | ORAL_TABLET | Freq: Two times a day (BID) | ORAL | Status: DC

## 2013-12-07 NOTE — ED Notes (Signed)
PATIENT DROVE TO ED TODAY

## 2013-12-07 NOTE — ED Notes (Signed)
Patient states moved a gun safe with his mother yesterday and pulled something in low to mid back.   Patient states that he has pain shooting into L hip/leg.

## 2013-12-07 NOTE — Discharge Instructions (Signed)
Call an orthopedic specialist for further evaluation of your hip and back pain. Call for a follow up appointment with a Family or Primary Care Provider.  Return if Symptoms worsen.   Take medication as prescribed.    Emergency Department Resource Guide 1) Find a Doctor and Pay Out of Pocket Although you won't have to find out who is covered by your insurance plan, it is a good idea to ask around and get recommendations. You will then need to call the office and see if the doctor you have chosen will accept you as a new patient and what types of options they offer for patients who are self-pay. Some doctors offer discounts or will set up payment plans for their patients who do not have insurance, but you will need to ask so you aren't surprised when you get to your appointment.  2) Contact Your Local Health Department Not all health departments have doctors that can see patients for sick visits, but many do, so it is worth a call to see if yours does. If you don't know where your local health department is, you can check in your phone book. The CDC also has a tool to help you locate your state's health department, and many state websites also have listings of all of their local health departments.  3) Find a Walk-in Clinic If your illness is not likely to be very severe or complicated, you may want to try a walk in clinic. These are popping up all over the country in pharmacies, drugstores, and shopping centers. They're usually staffed by nurse practitioners or physician assistants that have been trained to treat common illnesses and complaints. They're usually fairly quick and inexpensive. However, if you have serious medical issues or chronic medical problems, these are probably not your best option.  No Primary Care Doctor: - Call Health Connect at  [phone] - they can help you locate a primary care doctor that  accepts your insurance, provides certain services, etc. - Physician Referral Service-  [phone]  Chronic Pain Problems: Organization         Address  Phone   Notes  [facility]  [phone] Patients need to be referred by their primary care doctor.   Medication Assistance: Organization         Address  Phone   Notes  [facility] Medication [facility] [address] [place], [place] [address] [phone] --Must be a resident of [facility] [place] -- Must have NO insurance coverage whatsoever (no Medicaid/ Medicare, etc.) -- The pt. MUST have a primary care doctor that directs their care regularly and follows them in the community   Med[facility]ist  [phone]   [employer]  [phone]    Agencies that provide inexpensive medical care: Organization         Address  Phone   Notes  [facility]  [phone]   [facility]    [phone]   [facility] [address] [place], [place] [address] [phone]   [facility] [address], [place] [phone]   [facility]anned Parenthood    [phone]   [facility]    [phone]   [facility] and [facility]  [address], [place] Phone:  [phone], Fax:  [phone] Hours of Operation:  9 am - 6 pm, M-F.  Also accepts Medicaid/Medicare and self-pay.  [facility] for [place], [address], [place] Phone: [phone], Fax: [phone]. Hours of Operation:  8:30 am - 5:30 pm, M-F.  Also accepts Medicaid and self-pay.  [facility] [place] [address] [place] Phone: [phone]   [place], [place], [place] [phone], Ext. [phone] Mondays & Thursdays: 7-9 AM.  First 15 patients are seen on a first come, first serve basis.    [place] Providers:  Organization         Address  Phone   Notes  [facility] [address],  [place] [phone] Also accepts self-pay patients.  [facility] [id number] [place], [place]  [phone]   [place], [address], [place] [phone]   [facility] [address] [place] [phone]   Lucianne Lei [address], [place]   [phone] Only accepts [place] Access [place] patients after they have their name applied to their card.   Self-Pay (no insurance) in [facility]:  Organization         Address  Phone   Notes  Sickle Cell Patients, [facility] [place] [phone]   [facility]gent [facility] [place] [phone]   [facility] [place]  [place], [place], [place] [phone]   [facility]/Dr. Osei-Bonsu  [address] [place] [address] [place] or [address], [place] [phone] Phone number for both [place] and [place] locations is the same.  Urgent Medical and [facility] [address], [place] [phone]   [facility] [address] [place] or [address] [phone]   [facility] [address], [place] [phone], phone; [phone], fax Sees patients 1st and 3rd Saturday of every month.  Must not qualify for public or private insurance (i.e. Medicaid, Medicare, [place] Health Choice, Veterans' Benefits)  Household income should be no more than 200% of the poverty level The clinic cannot treat you if you are pregnant or think you are pregnant  Sexually transmitted diseases are not treated at the clinic.    Dental Care: Organization         Address  Phone  Notes  [facility] Department of [facility] [place] [phone] Accepts children up to age 81 who are enrolled in  [place] or [place]; pregnant women with a Medicaid card; and children who have applied for Medicaid or  Health Choice, but were declined, whose parents can pay a reduced fee at time of service.  [facility] Department of [facility]  [address], [place] [phone] Accepts children up to age 44 who are enrolled in [place] or [place]; pregnant women with a Medicaid card; and children who have applied for Medicaid or  Health Choice, but were declined, whose parents can pay a reduced fee at time of service.  [facility] PRO[facility]  [place] [phone] Patients are seen by appointment only. Walk-ins are not accepted. [place] will see patients 52 years of age and older. Monday - Tuesday (8am-5pm) Most Wednesdays (8:30-5pm) $30 per  visit, cash only  [facility] Adult [employer] PROGRAM  [address], [address] [phone] Patients are seen by appointment only. Walk-ins are not accepted. [place] will see patients 48 years of age and older. One Wednesday Evening (Monthly: Volunteer Based).  $30 per visit, cash only  [place]  [phone] for adults; Children under age 62, call Graduate Pediatric Dentistry at [phone]. Children aged 41-14, please call [phone] to request a pediatric application.  Dental services are provided in all areas of dental care including fillings, crowns and bridges, complete and partial dentures, implants, gum treatment, root canals, and extractions. Preventive care is also provided. Treatment is provided to both adults and children. Patients are selected via a lottery and there is often a waiting list.   [facility] [address] [place]  [phone] [email or web address]   [facility] [address] [place], [place] [phone], Ext. [phone] Second and Fourth Thursday of each month, opens at 6:30  AM; Clinic ends at 9 AM.  Patients are seen on a first-come first-served basis, and a limited number are seen during each clinic.   [facility]  [address] [place] Danker [place], [place] [phone]   Eligibility Requirements You must have lived in [place], [place], or [place] counties for at least the last three months.   You cannot be eligible for state or federal sponsored [employer], including [employer], [place], or [employer].   You generally cannot be eligible for healthcare insurance through your employer.    How to apply: Eligibility screenings are held every Tuesday and Wednesday afternoon from 1:00 pm until 4:00 pm. You do not need an appointment for the interview!  [facility] [address], [place], [place]   [place]  [place] Department  [place]  [phone]    Behavioral Health Resources in the Community: Intensive Outpatient Programs Organization         Address  Phone  Notes  [place]ru D[place]. [address] [place], [place] [phone]   [facility] Outpatient [address] [place], [place]   ADS: Alcohol & Drug Svcs [address] [place], [place]   [place] [address]  [place], [place] or [phone]   Substance Abuse Resources Organization         Address  Phone  Notes  Alcohol and Drug Services  [phone]   [place]  [phone]   [facility] [place]   Chinita Pester  [phone]   Residential & Outpatient Substance Abuse Program  [phone]   Psychological Services Organization         Address  Phone  Notes  [facility] [place]  [place]  [phone]   [place] [address] [place] or  [phone]    Mobile Crisis Teams Organization         Address  Phone  Notes  [facility], Mobile Crisis Care Unit  [phone]   [facility]  [address] [place], [place]   [place] [address], [place] [phone]    Self-Help/Support Groups Organization         Address  Phone  Notes  [facility] Assoc[facility] of [place] - variety of support groups  [place] Call for more information  Narcotics Anonymous (NA), [facility] [address], [employer] [place]  2 meetings at this location   Special educational needs teacher         Address  Phone  Notes  ASAP Residential Treatment [place],    [place]  [phone]   [facility]  [address] [place] [id number], [place], [place]   [facility] [place], [place] [phone] Admissions: 8am-3pm M-F  Incentives Substance [place] [address]    [place], [place] [phone]   [facility] [address] [place], [place], [place]   [facility] [address],  [place], [place]   [facility] Programs - Intensive Outpatient [address] Kristeen Mans [address], [place], [place]   [facility] ([place] [address]  [place], [place] [phone] or [phone]   [facility] Services (RTS) [address], [place], [place] Accepts Medicaid  [facility] [place] [address]  [place] [phone] Substance Abuse/Addiction Treatment   [facility] Organization         Address  Phone  Notes  [facility]  [phone]   Domenic Schwab, PhD [address] Arlis Porta [place], [place]   [phone] or [phone]   [place], [place] [phone]   [facility] [address],  [place], [place] [phone] Insurance/Medicaid/sponsorship through [facility] and Families [address] [place]                                    [place], [place] [phone] [place] [address] [place], [place] [phone]    Dr. Adele Schilder  [phone]   [facility] Dept. 1) [address] [place] 2) [place] 3)  [place] [address], [place] [phone]  [phone]   [place] [phone] or [phone] (After Hours)

## 2013-12-07 NOTE — ED Notes (Signed)
Patient upset that he was not getting narcotics.   Patient states that he did not want to wait for papers and asked for the way out.   Patient left before receiving discharge paperwork.  Walked out with normal gait and no difficulties.
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
## 2013-12-09 NOTE — ED Provider Notes (Signed)
Medical screening examination/treatment/procedure(s) were performed by non-physician practitioner and as supervising physician I was immediately available for consultation/collaboration.   EKG Interpretation None        Daking Westervelt N Iliya Spivack, DO 12/09/13 1100 

## 2014-02-24 ENCOUNTER — Encounter (HOSPITAL_COMMUNITY): Payer: Self-pay | Admitting: Emergency Medicine

## 2014-02-24 ENCOUNTER — Inpatient Hospital Stay (HOSPITAL_COMMUNITY)
Admission: EM | Admit: 2014-02-24 | Discharge: 2014-02-28 | DRG: 918 | Disposition: A | Discharge status: Other Acute Inpatient Hospital | Payer: Medicaid Other | Attending: Internal Medicine | Admitting: Internal Medicine

## 2014-02-24 DIAGNOSIS — T43592A Poisoning by other antipsychotics and neuroleptics, intentional self-harm, initial encounter: Principal | ICD-10-CM | POA: Diagnosis present

## 2014-02-24 DIAGNOSIS — F17210 Nicotine dependence, cigarettes, uncomplicated: Secondary | ICD-10-CM | POA: Diagnosis present

## 2014-02-24 DIAGNOSIS — F332 Major depressive disorder, recurrent severe without psychotic features: Secondary | ICD-10-CM | POA: Diagnosis present

## 2014-02-24 DIAGNOSIS — Z9189 Other specified personal risk factors, not elsewhere classified: Secondary | ICD-10-CM

## 2014-02-24 DIAGNOSIS — Z79899 Other long term (current) drug therapy: Secondary | ICD-10-CM | POA: Diagnosis not present

## 2014-02-24 DIAGNOSIS — Z9151 Personal history of suicidal behavior: Secondary | ICD-10-CM

## 2014-02-24 DIAGNOSIS — E860 Dehydration: Secondary | ICD-10-CM | POA: Diagnosis present

## 2014-02-24 DIAGNOSIS — E872 Acidosis: Secondary | ICD-10-CM | POA: Diagnosis present

## 2014-02-24 DIAGNOSIS — Z915 Personal history of self-harm: Secondary | ICD-10-CM

## 2014-02-24 DIAGNOSIS — T50902A Poisoning by unspecified drugs, medicaments and biological substances, intentional self-harm, initial encounter: Secondary | ICD-10-CM

## 2014-02-24 DIAGNOSIS — R000 Tachycardia, unspecified: Secondary | ICD-10-CM

## 2014-02-24 DIAGNOSIS — G8929 Other chronic pain: Secondary | ICD-10-CM | POA: Diagnosis present

## 2014-02-24 DIAGNOSIS — I471 Supraventricular tachycardia: Secondary | ICD-10-CM

## 2014-02-24 DIAGNOSIS — T50902D Poisoning by unspecified drugs, medicaments and biological substances, intentional self-harm, subsequent encounter: Secondary | ICD-10-CM

## 2014-02-24 DIAGNOSIS — T50901A Poisoning by unspecified drugs, medicaments and biological substances, accidental (unintentional), initial encounter: Secondary | ICD-10-CM | POA: Insufficient documentation

## 2014-02-24 DIAGNOSIS — R4182 Altered mental status, unspecified: Secondary | ICD-10-CM | POA: Diagnosis not present

## 2014-02-24 DIAGNOSIS — R569 Unspecified convulsions: Secondary | ICD-10-CM | POA: Diagnosis present

## 2014-02-24 DIAGNOSIS — Y92010 Kitchen of single-family (private) house as the place of occurrence of the external cause: Secondary | ICD-10-CM

## 2014-02-24 DIAGNOSIS — F3130 Bipolar disorder, current episode depressed, mild or moderate severity, unspecified: Secondary | ICD-10-CM

## 2014-02-24 DIAGNOSIS — F419 Anxiety disorder, unspecified: Secondary | ICD-10-CM | POA: Diagnosis present

## 2014-02-24 HISTORY — DX: Major depressive disorder, single episode, unspecified: F32.9

## 2014-02-24 HISTORY — DX: Unspecified convulsions: R56.9

## 2014-02-24 HISTORY — DX: Anxiety disorder, unspecified: F41.9

## 2014-02-24 HISTORY — DX: Bipolar disorder, unspecified: F31.9

## 2014-02-24 HISTORY — DX: Depression, unspecified: F32.A

## 2014-02-24 LAB — SALICYLATE LEVEL

## 2014-02-24 LAB — COMPREHENSIVE METABOLIC PANEL
ALBUMIN: 3.9 g/dL (ref 3.5–5.2)
ALT: 11 U/L (ref 0–53)
AST: 16 U/L (ref 0–37)
Alkaline Phosphatase: 89 U/L (ref 39–117)
Anion gap: 14 (ref 5–15)
BUN: 10 mg/dL (ref 6–23)
CO2: 22 mEq/L (ref 19–32)
Calcium: 9.3 mg/dL (ref 8.4–10.5)
Chloride: 102 mEq/L (ref 96–112)
Creatinine, Ser: 0.78 mg/dL (ref 0.50–1.35)
GFR calc Af Amer: >90 mL/min (ref >90)
GFR calc non Af Amer: >90 mL/min (ref >90)
Glucose, Bld: 99 mg/dL (ref 70–99)
Potassium: 4.6 mEq/L (ref 3.7–5.3)
SODIUM: 138 meq/L (ref 137–147)
Total Bilirubin: <0.2 mg/dL — ABNORMAL LOW (ref 0.3–1.2)
Total Protein: 7.1 g/dL (ref 6.0–8.3)

## 2014-02-24 LAB — CBC
HCT: 40.8 % (ref 39.0–52.0)
Hemoglobin: 14.5 g/dL (ref 13.0–17.0)
MCH: 32.9 pg (ref 26.0–34.0)
MCHC: 35.5 g/dL (ref 30.0–36.0)
MCV: 92.5 fL (ref 78.0–100.0)
PLATELETS: 306 10*3/uL (ref 150–400)
RBC: 4.41 MIL/uL (ref 4.22–5.81)
RDW: 13.7 % (ref 11.5–15.5)
WBC: 18 10*3/uL — ABNORMAL HIGH (ref 4.0–10.5)

## 2014-02-24 LAB — ACETAMINOPHEN LEVEL

## 2014-02-24 LAB — CBG MONITORING, ED: Glucose-Capillary: 92 mg/dL (ref 70–99)

## 2014-02-24 LAB — ETHANOL

## 2014-02-24 MED ORDER — SODIUM CHLORIDE 0.9 % IV SOLN
INTRAVENOUS | Status: DC
  Administered 2014-02-24: 22:00:00 via INTRAVENOUS
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
## 2014-02-24 NOTE — ED Notes (Signed)
[facility] CALLED FOR UPDATE ON PT.  DEBRA GIVEN LAB RESULTS AND EKG RESULTS.

## 2014-02-24 NOTE — ED Notes (Signed)
Tramaine, EMT sitting with pt at this time.
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
## 2014-02-24 NOTE — ED Notes (Addendum)
Report from [facility]> wife states pt had witnessed seizure lasting approx 3-4 minutes.  States pt fell in kitchen and R side of body hit stove. Wife reports that pt's face turned purple and yellow and he stopped breathing. Wife performed CPR for approx 4 min.  On fire dept arrival, pt was unresponsive.  When they attempted to insert a NPA pt became combative.  On arrival to ED, pt is alert and oriented- slow to answer questions.  C/o pain to bilateral knees and R hip.  Pt did bite tongue.  Wife also reports that pt has been taking more Seroquel than prescribed because "it gives him a high."  Pt had Seroquel 45m filled on 02/17/14 that he is suppose to take three times per day and 83 pills are gone from bottle.  He also had Seroquel 200 mg filled on 02/17/14 that he is suppose to take 4 tablets each night and 76 pills are gone from bottle.  Wife is asking pt if he was trying to kill himself.  She states pt was IVC last week for suicidal ideations [facility]    Pt denies being suicidal and states he doesn't know why he took extra medication but states he took it yesterday.  Wife reports pt has not had a seizure in the last 5 years since she met him.  States his mother, that is deceased, had reported to her that pt had childhood seizures.

## 2014-02-25 DIAGNOSIS — Z915 Personal history of self-harm: Secondary | ICD-10-CM

## 2014-02-25 DIAGNOSIS — R000 Tachycardia, unspecified: Secondary | ICD-10-CM

## 2014-02-25 DIAGNOSIS — T50901A Poisoning by unspecified drugs, medicaments and biological substances, accidental (unintentional), initial encounter: Secondary | ICD-10-CM | POA: Diagnosis present

## 2014-02-25 DIAGNOSIS — Z9151 Personal history of suicidal behavior: Secondary | ICD-10-CM

## 2014-02-25 DIAGNOSIS — G8929 Other chronic pain: Secondary | ICD-10-CM

## 2014-02-25 LAB — COMPREHENSIVE METABOLIC PANEL
ALBUMIN: 3.6 g/dL (ref 3.5–5.2)
ALT: 10 U/L (ref 0–53)
ALT: 10 U/L (ref 0–53)
ANION GAP: 13 (ref 5–15)
AST: 14 U/L (ref 0–37)
AST: 15 U/L (ref 0–37)
Albumin: 3.6 g/dL (ref 3.5–5.2)
Alkaline Phosphatase: 87 U/L (ref 39–117)
Alkaline Phosphatase: 86 U/L (ref 39–117)
Anion gap: 15 (ref 5–15)
BILIRUBIN TOTAL: 0.3 mg/dL (ref 0.3–1.2)
BILIRUBIN TOTAL: 0.2 mg/dL — AB (ref 0.3–1.2)
BUN: 9 mg/dL (ref 6–23)
BUN: 8 mg/dL (ref 6–23)
CALCIUM: 9.1 mg/dL (ref 8.4–10.5)
CO2: 22 meq/L (ref 19–32)
CO2: 23 mEq/L (ref 19–32)
CREATININE: 0.77 mg/dL (ref 0.50–1.35)
CREATININE: 0.79 mg/dL (ref 0.50–1.35)
Calcium: 8.9 mg/dL (ref 8.4–10.5)
Chloride: 104 mEq/L (ref 96–112)
Chloride: 106 mEq/L (ref 96–112)
GFR calc Af Amer: >90 mL/min (ref >90)
GFR calc Af Amer: >90 mL/min (ref >90)
GFR calc non Af Amer: >90 mL/min (ref >90)
GLUCOSE: 95 mg/dL (ref 70–99)
Glucose, Bld: 90 mg/dL (ref 70–99)
Potassium: 4.3 mEq/L (ref 3.7–5.3)
Potassium: 4.4 mEq/L (ref 3.7–5.3)
Sodium: 141 mEq/L (ref 137–147)
Sodium: 142 mEq/L (ref 137–147)
TOTAL PROTEIN: 6.6 g/dL (ref 6.0–8.3)
Total Protein: 6.7 g/dL (ref 6.0–8.3)

## 2014-02-25 LAB — CBC
HCT: 41 % (ref 39.0–52.0)
HEMOGLOBIN: 14.3 g/dL (ref 13.0–17.0)
MCH: 32.6 pg (ref 26.0–34.0)
MCHC: 34.9 g/dL (ref 30.0–36.0)
MCV: 93.6 fL (ref 78.0–100.0)
Platelets: 278 10*3/uL (ref 150–400)
RBC: 4.38 MIL/uL (ref 4.22–5.81)
RDW: 13.9 % (ref 11.5–15.5)
WBC: 9.5 10*3/uL (ref 4.0–10.5)

## 2014-02-25 LAB — CBC WITH DIFFERENTIAL/PLATELET
Basophils Absolute: 0 10*3/uL (ref 0.0–0.1)
Basophils Relative: 0 % (ref 0–1)
EOS PCT: 4 % (ref 0–5)
Eosinophils Absolute: 0.4 10*3/uL (ref 0.0–0.7)
HCT: 41.4 % (ref 39.0–52.0)
HEMOGLOBIN: 14.5 g/dL (ref 13.0–17.0)
LYMPHS ABS: 3 10*3/uL (ref 0.7–4.0)
Lymphocytes Relative: 28 % (ref 12–46)
MCH: 33 pg (ref 26.0–34.0)
MCHC: 35 g/dL (ref 30.0–36.0)
MCV: 94.3 fL (ref 78.0–100.0)
MONO ABS: 0.9 10*3/uL (ref 0.1–1.0)
MONOS PCT: 9 % (ref 3–12)
NEUTROS ABS: 6.4 10*3/uL (ref 1.7–7.7)
Neutrophils Relative %: 59 % (ref 43–77)
Platelets: 292 10*3/uL (ref 150–400)
RBC: 4.39 MIL/uL (ref 4.22–5.81)
RDW: 14 % (ref 11.5–15.5)
WBC: 10.7 10*3/uL — ABNORMAL HIGH (ref 4.0–10.5)

## 2014-02-25 LAB — TROPONIN I: Troponin I: <0.3 ng/mL (ref <0.30)

## 2014-02-25 LAB — RAPID URINE DRUG SCREEN, HOSP PERFORMED
Amphetamines: NOT DETECTED
BENZODIAZEPINES: NOT DETECTED
Barbiturates: NOT DETECTED
Cocaine: NOT DETECTED
Opiates: NOT DETECTED
Tetrahydrocannabinol: NOT DETECTED

## 2014-02-25 LAB — PROTIME-INR
INR: 1.04 (ref 0.00–1.49)
Prothrombin Time: 13.6 seconds (ref 11.6–15.2)

## 2014-02-25 LAB — PROLACTIN: Prolactin: 6 ng/mL (ref 2.1–17.1)

## 2014-02-25 LAB — LACTIC ACID, PLASMA: LACTIC ACID, VENOUS: 2.9 mmol/L — AB (ref 0.5–2.2)

## 2014-02-25 LAB — ACETAMINOPHEN LEVEL: Acetaminophen (Tylenol), Serum: <15 ug/mL (ref 10–30)

## 2014-02-25 MED ORDER — SODIUM CHLORIDE 0.9 % IV SOLN: INTRAVENOUS | Status: AC

## 2014-02-25 MED ORDER — ONDANSETRON HCL 4 MG/2ML IJ SOLN: 4.0000 mg | Freq: Four times a day (QID) | INTRAMUSCULAR | Status: DC | PRN

## 2014-02-25 MED ORDER — DIPHENHYDRAMINE HCL 25 MG PO CAPS
25.0000 mg | ORAL_CAPSULE | Freq: Once | ORAL | Status: DC
  Filled 2014-02-25: qty 1

## 2014-02-25 MED ORDER — ONDANSETRON HCL 4 MG PO TABS: 4.0000 mg | ORAL_TABLET | Freq: Four times a day (QID) | ORAL | Status: DC | PRN

## 2014-02-25 MED ORDER — HEPARIN SODIUM (PORCINE) 5000 UNIT/ML IJ SOLN
5000.0000 [IU] | Freq: Three times a day (TID) | INTRAMUSCULAR | Status: DC
  Administered 2014-02-25 – 2014-02-28 (×11): 5000 [IU] via SUBCUTANEOUS
  Filled 2014-02-25 (×13): qty 1

## 2014-02-25 MED ORDER — SODIUM CHLORIDE 0.9 % IV SOLN
INTRAVENOUS | Status: DC
  Administered 2014-02-25 – 2014-02-26 (×2): via INTRAVENOUS

## 2014-02-25 MED ORDER — ETODOLAC 200 MG PO CAPS
200.0000 mg | ORAL_CAPSULE | Freq: Every day | ORAL | Status: DC
  Administered 2014-02-25 – 2014-02-28 (×15): 200 mg via ORAL
  Filled 2014-02-25 (×19): qty 1

## 2014-02-25 MED ORDER — LORAZEPAM 2 MG/ML IJ SOLN: 0.5000 mg | INTRAMUSCULAR | Status: DC | PRN

## 2014-02-25 MED ORDER — SODIUM CHLORIDE 0.9 % IJ SOLN
3.0000 mL | Freq: Two times a day (BID) | INTRAMUSCULAR | Status: DC
  Administered 2014-02-25 – 2014-02-27 (×4): 3 mL via INTRAVENOUS

## 2014-02-25 MED ORDER — OXYCODONE HCL 5 MG PO TABS
5.0000 mg | ORAL_TABLET | Freq: Four times a day (QID) | ORAL | Status: DC | PRN
  Administered 2014-02-25 – 2014-02-28 (×12): 5 mg via ORAL
  Filled 2014-02-25 (×12): qty 1
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
## 2014-02-25 NOTE — ED Notes (Signed)
Pt resting; sitter at bedside; no needs at this time

## 2014-02-25 NOTE — ED Notes (Signed)
Pt finishing up eating breakfast tray; relieving sitter for a break

## 2014-02-25 NOTE — ED Notes (Signed)
Pt up ambulatory to the bathroom at this time 

## 2014-02-25 NOTE — ED Notes (Signed)
Relieving sitter for lunch break; pt sitting watching tv

## 2014-02-25 NOTE — ED Notes (Signed)
Heart healthy diet ordered. 

## 2014-02-25 NOTE — ED Notes (Signed)
Ordered breakfast 

## 2014-02-25 NOTE — ED Notes (Signed)
Pt has returned from using the bathroom; pt placed back on monitor, continuous pulse oximetry and blood pressure cuff; pt watching tv; no needs at this time

## 2014-02-25 NOTE — ED Notes (Signed)
Pt resting, watching tv; sitter at bedside; no needs at this time

## 2014-02-25 NOTE — ED Notes (Signed)
Sitter has returned from lunch break; pt talking on phone

## 2014-02-25 NOTE — ED Provider Notes (Signed)
I saw and evaluated the patient, reviewed the resident's note and I agree with the findings and plan.   .Face to face Exam:  General:  Awake HEENT:  Atraumatic Resp:  Normal effort Abd:  Nondistended Neuro:No focal weakness  CRITICAL CARE Performed by: Nelva NayBEATON,Climmie Buelow L Total critical care time: 30 Critical care time was exclusive of separately billable procedures and treating other patients. Critical care was necessary to treat or prevent imminent or life-threatening deterioration. Critical care was time spent personally by me on the following activities: development of treatment plan with patient and/or surrogate as well as nursing, discussions with consultants, evaluation of patient's response to treatment, examination of patient, obtaining history from patient or surrogate, ordering and performing treatments and interventions, ordering and review of laboratory studies, ordering and review of radiographic studies, pulse oximetry and re-evaluation of patient's condition.   Nelia Shiobert L Abbas Beyene, MD 02/25/14 2037

## 2014-02-26 DIAGNOSIS — T50902D Poisoning by unspecified drugs, medicaments and biological substances, intentional self-harm, subsequent encounter: Secondary | ICD-10-CM

## 2014-02-26 DIAGNOSIS — R45851 Suicidal ideations: Secondary | ICD-10-CM

## 2014-02-26 DIAGNOSIS — R569 Unspecified convulsions: Secondary | ICD-10-CM

## 2014-02-26 DIAGNOSIS — F3160 Bipolar disorder, current episode mixed, unspecified: Secondary | ICD-10-CM

## 2014-02-26 LAB — CBC
HCT: 41.1 % (ref 39.0–52.0)
HEMOGLOBIN: 14.3 g/dL (ref 13.0–17.0)
MCH: 32.5 pg (ref 26.0–34.0)
MCHC: 34.8 g/dL (ref 30.0–36.0)
MCV: 93.4 fL (ref 78.0–100.0)
Platelets: 274 10*3/uL (ref 150–400)
RBC: 4.4 MIL/uL (ref 4.22–5.81)
RDW: 13.4 % (ref 11.5–15.5)
WBC: 8.5 10*3/uL (ref 4.0–10.5)

## 2014-02-26 LAB — BASIC METABOLIC PANEL
ANION GAP: 11 (ref 5–15)
BUN: 14 mg/dL (ref 6–23)
CALCIUM: 9 mg/dL (ref 8.4–10.5)
CO2: 23 meq/L (ref 19–32)
CREATININE: 0.77 mg/dL (ref 0.50–1.35)
Chloride: 105 mEq/L (ref 96–112)
GFR calc Af Amer: >90 mL/min (ref >90)
GFR calc non Af Amer: >90 mL/min (ref >90)
GLUCOSE: 92 mg/dL (ref 70–99)
Potassium: 4.4 mEq/L (ref 3.7–5.3)
SODIUM: 139 meq/L (ref 137–147)

## 2014-02-26 LAB — LACTIC ACID, PLASMA: LACTIC ACID, VENOUS: 1.1 mmol/L (ref 0.5–2.2)

## 2014-02-26 MED ORDER — DIVALPROEX SODIUM ER 500 MG PO TB24
1000.0000 mg | ORAL_TABLET | Freq: Every day | ORAL | Status: DC
  Administered 2014-02-26 – 2014-02-27 (×2): 1000 mg via ORAL
  Filled 2014-02-26 (×3): qty 2

## 2014-02-26 MED ORDER — ZOLPIDEM TARTRATE 5 MG PO TABS
10.0000 mg | ORAL_TABLET | Freq: Every evening | ORAL | Status: DC | PRN
  Administered 2014-02-26 – 2014-02-27 (×2): 10 mg via ORAL
  Filled 2014-02-26 (×2): qty 2

## 2014-02-26 MED ORDER — ZIPRASIDONE HCL 40 MG PO CAPS
40.0000 mg | ORAL_CAPSULE | Freq: Two times a day (BID) | ORAL | Status: DC
  Administered 2014-02-26 – 2014-02-28 (×4): 40 mg via ORAL
  Filled 2014-02-26 (×6): qty 1

## 2014-02-26 NOTE — Progress Notes (Signed)
Patient has arrived on the unit with sitter.

## 2014-02-26 NOTE — Progress Notes (Signed)
Patient ID: Donald ShinesRichard C Sullivan  male  ZOX:[id number]    DOB: May 08, 1981    DOA: 02/24/2014  PCP: Aida PufferLITTLE,JAMES, MD  Brief history of present illness [name] is a 33 y.o. male with Past medical history of depression, bipolar disorder, suicidal attempts, substance.  The patient presented with seizure episode. As per the ED patient's wife mentions that patient has been using excess dose of Seroquel since his recent discharge from the behavioral health.  He has prescription for 200 mg and 50 mg tablets of Seroquel which he refilled 8 days ago and is out of 80+ from both bottles.    Assessment/Plan: Principal Problem:   Drug overdose with Seroquel: Presenting with a seizure episode and unresponsive - Alert and awake - Continue to monitor closely, seizure precautions, psych consult placed, still pending  Active Problems: Seizure: - EEG ordered however seizure episode likely due to Seroquel overdose, ambien (patient finished 10 mg Ambien prescription in 3 days, 10/2 received them - 10/4). Patient also reports that he takes Depakote for depression and mood stabilizing (not for seizures)    MDD (major depressive disorder), recurrent severe, without psychosis with history of prior suicide attempts - Psychiatry consulted - He takes Seroquel, Prozac, Depakote will defer to psychiatry for medication    Chronic pain: ? Pain medication seeking behavior - Placed on oxycodone, patient reports that he takes oxycodone 5 mg at home every 4 hours, although not listed in the AVS, he states that it is prescribed by Dr. Clarene DukeLittle. Discussed with pharmacy, who will clarify his outpatient dose. I am strongly suspecting pain medication seeking behavior. I will not increase his dose or dec frequency at this time. - Restart rtodolac    Tachycardia: Likely due to #1, dehydration, lactic acidosis - Continue IV fluid hydration, recheck lactic acid normal  DVT Prophylaxis:  Code Status:  Family  Communication:  Disposition: Will transfer to neuro floor  Consultants:  Psychiatry  Procedures:  None  Antibiotics:  None    Subjective: Patient seen and examined in ED, no complaints except asking for pain medications  Objective: Weight change: -0.839 kg (-1 lb 13.6 oz)  Intake/Output Summary (Last 24 hours) at 02/26/14 0646 Last data filed at 02/26/14 0500  Gross per 24 hour  Intake   1103 ml  Output      0 ml  Net   1103 ml   Blood pressure 126/79, pulse 86, temperature 98.1 F (36.7 C), temperature source Oral, resp. rate 18, height 5\' 8"  (1.727 m), weight 69 kg (152 lb 1.9 oz), SpO2 97.00%.  Physical Exam: General: Alert and awake, oriented x3, not in any acute distress. CVS: S1-S2 clear, no murmur rubs or gallops Chest: clear to auscultation bilaterally Abdomen: soft nontender, nondistended, normal bowel sounds  Extremities: no cyanosis, clubbing or edema noted bilaterally Neuro: Cranial nerves II-XII intact, no focal neurological deficits  Lab Results: Basic Metabolic Panel:  Recent Labs Lab 02/25/14 0255 02/25/14 0614  NA 141 142  K 4.3 4.4  CL 104 106  CO2 22 23  GLUCOSE 95 90  BUN 9 8  CREATININE 0.77 0.79  CALCIUM 9.1 8.9   Liver Function Tests:  Recent Labs Lab 02/25/14 0255 02/25/14 0614  AST 14 15  ALT 10 10  ALKPHOS 87 86  BILITOT 0.3 0.2*  PROT 6.7 6.6  ALBUMIN 3.6 3.6   No results found for this basename: LIPASE, AMYLASE,  in the last 168 hours No results found for this basename: AMMONIA,  in the last 168 hours CBC:  Recent Labs Lab 02/25/14 0255 02/25/14 0614  WBC 10.7* 9.5  NEUTROABS 6.4  --   HGB 14.5 14.3  HCT 41.4 41.0  MCV 94.3 93.6  PLT 292 278   Cardiac Enzymes:  Recent Labs Lab 02/25/14 0255  TROPONINI <0.30   BNP: No components found with this basename: POCBNP,  CBG:  Recent Labs Lab 02/24/14 1934  GLUCAP 92     Micro Results: No results found for this or any previous visit (from the  past 240 hour(s)).  Studies/Results: No results found.  Medications: Scheduled Meds: . diphenhydrAMINE  25 mg Oral Once  . etodolac  200 mg Oral 5 X Daily  . heparin  5,000 Units Subcutaneous 3 times per day  . sodium chloride  3 mL Intravenous Q12H      LOS: 2 days   Jeneane Pieczynski M.D. [facility] 02/26/2014, 6:46 AM Pager: [phone]  If 7PM-7AM, please contact night-coverage [email or web address] Password [email or web address]
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
## 2014-02-27 ENCOUNTER — Inpatient Hospital Stay (HOSPITAL_COMMUNITY): Payer: Medicaid Other

## 2014-02-27 LAB — VALPROIC ACID LEVEL: Valproic Acid Lvl: 29.6 ug/mL — ABNORMAL LOW (ref 50.0–100.0)

## 2014-02-27 NOTE — Progress Notes (Signed)
Patient ID: Donald ShinesRichard C Tomkins  male  ZOX:[id number]    DOB: [birth year]    DOA: 02/24/2014  PCP: Aida PufferLITTLE,JAMES, MD  Brief history of present illness [name] is a 33 y.o. male with Past medical history of depression, bipolar disorder, suicidal attempts, substance.  The patient presented with seizure episode. As per the ED patient's wife mentions that patient has been using excess dose of Seroquel since his recent discharge from the behavioral health.  He has prescription for 200 mg and 50 mg tablets of Seroquel which he refilled 8 days ago and is out of 80+ from both bottles.    Assessment/Plan: Principal Problem:   Drug overdose with Seroquel: Presenting with a seizure episode and unresponsive - Alert and awake, no issues, EKG shows QTC 425, improved from admission EKG - Continue to monitor closely, seizure precautions, psych consulted, medically clear  Active Problems: Seizure: - EEG done however seizure episode likely due to Seroquel overdose, ambien (patient finished 10 mg Ambien prescription in 3 days, 10/2 received them - 10/4). Patient also reports that he takes Depakote for depression and mood stabilizing (not for seizures)    MDD (major depressive disorder), recurrent severe, without psychosis with history of prior suicide attempts - Psychiatry consulted - Depakote, Ambien and Geodon restarted by psychiatry    Chronic pain: ? Pain medication seeking behavior - Placed on oxycodone, patient reports that he takes oxycodone 5 mg at home every 4 hours, although not listed in the AVS, he states that it is prescribed by Dr. Clarene DukeLittle. Discussed with pharmacy, who will clarify his outpatient dose. I am strongly suspecting pain medication seeking behavior. I will not increase his dose or dec frequency at this time. - Restarted etodolac    Tachycardia: Likely due to #1, dehydration, lactic acidosis - Resolved after fluid hydration  DVT Prophylaxis:  Code Status:  Family  Communication:  Disposition: Medically clear, disposition per psych pending once that is available  Consultants:  Psychiatry  Procedures:  None  Antibiotics:  None    Subjective: No more seizures, sitter at the bedside, patient continuing to ask for his pain medications again  Objective: Weight change:   Intake/Output Summary (Last 24 hours) at 02/27/14 1258 Last data filed at 02/27/14 1224  Gross per 24 hour  Intake    480 ml  Output      0 ml  Net    480 ml   Blood pressure 129/89, pulse 74, temperature 98.3 F (36.8 C), temperature source Oral, resp. rate 18, height 5\' 8"  (1.727 m), weight 69 kg (152 lb 1.9 oz), SpO2 98.00%.  Physical Exam: General: Alert and awake, oriented x3, not in any acute distress. CVS: S1-S2 clear, no murmur rubs or gallops Chest: clear to auscultation bilaterally Abdomen: soft nontender, nondistended, normal bowel sounds  Extremities: no cyanosis, clubbing or edema noted bilaterally Neuro: Cranial nerves II-XII intact, no focal neurological deficits  Lab Results: Basic Metabolic Panel:  Recent Labs Lab 02/25/14 0614 02/26/14 0740  NA 142 139  K 4.4 4.4  CL 106 105  CO2 23 23  GLUCOSE 90 92  BUN 8 14  CREATININE 0.79 0.77  CALCIUM 8.9 9.0   Liver Function Tests:  Recent Labs Lab 02/25/14 0255 02/25/14 0614  AST 14 15  ALT 10 10  ALKPHOS 87 86  BILITOT 0.3 0.2*  PROT 6.7 6.6  ALBUMIN 3.6 3.6   No results found for this basename: LIPASE, AMYLASE,  in the last 168 hours No  results found for this basename: AMMONIA,  in the last 168 hours CBC:  Recent Labs Lab 02/25/14 0255 02/25/14 0614 02/26/14 0740  WBC 10.7* 9.5 8.5  NEUTROABS 6.4  --   --   HGB 14.5 14.3 14.3  HCT 41.4 41.0 41.1  MCV 94.3 93.6 93.4  PLT 292 278 274   Cardiac Enzymes:  Recent Labs Lab 02/25/14 0255  TROPONINI <0.30   BNP: No components found with this basename: POCBNP,  CBG:  Recent Labs Lab 02/24/14 1934  GLUCAP 92      Micro Results: No results found for this or any previous visit (from the past 240 hour(s)).  Studies/Results: No results found.  Medications: Scheduled Meds: . diphenhydrAMINE  25 mg Oral Once  . divalproex  1,000 mg Oral QHS  . etodolac  200 mg Oral 5 X Daily  . heparin  5,000 Units Subcutaneous 3 times per day  . sodium chloride  3 mL Intravenous Q12H  . ziprasidone  40 mg Oral BID WC      LOS: 3 days   RAI,RIPUDEEP M.D. [facility] 02/27/2014, 12:58 PM Pager: [phone]  If 7PM-7AM, please contact night-coverage [email or web address] Password [email or web address]
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
## 2014-02-27 NOTE — Clinical Social Work Psych Note (Addendum)
Per report from MD, pt is medically stable and ready for dc.  Psych CSW sent referral to the following facilities: [facility] - referral made- at capacity [facility]- [facility]- no [facility] available   Psych CSW will continue to follow and assist.  Vickii PennaGina Julita Ozbun, LCSWA [phone]  Psychiatric & Orthopedics (5N 1-16) Clinical Social Worker

## 2014-02-27 NOTE — Progress Notes (Signed)
EEG Completed; Results Pending  
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
## 2014-02-27 NOTE — Procedures (Signed)
ELECTROENCEPHALOGRAM REPORT  Date of Study: 02/27/2014  Patient's Name: [name] MRN: [id number] Date of Birth: 1980-09-21  Referring Provider: Dr. Thad Rangeripudeep Rai  Clinical History: This is a 33 year old man with a history of depression, bipolar disorder, suicidal attempts, substance abuse, admitted with seizure.  Patient's wife mentions that patient has been using excess dose of Seroquel since his recent discharge from the behavioral health.    Medications: diphenhydrAMINE (BENADRYL) capsule 25 mg  divalproex (DEPAKOTE ER) 24 hr tablet 1,000 mg  etodolac (LODINE) capsule 200 mg  oxyCODONE (Oxy IR/ROXICODONE) immediate release tablet 5 mg  ziprasidone (GEODON) capsule 40 mg  zolpidem (AMBIEN) tablet 10 mg   Technical Summary: A multichannel digital EEG recording measured by the international 10-20 system with electrodes applied with paste and impedances below 5000 ohms performed in our laboratory with EKG monitoring in an awake and asleep patient.  Hyperventilation was not performed.  Photic stimulation was performed.  The digital EEG was referentially recorded, reformatted, and digitally filtered in a variety of bipolar and referential montages for optimal display.    Description: The patient is awake and asleep during the recording.  During maximal wakefulness, there is a symmetric, medium voltage 8.5-9 Hz posterior dominant rhythm that attenuates with eye opening.  This is admixed with a moderate amount of diffuse 5-6 Hz theta and small amount of diffuse 2-3 Hz delta slowing of the waking background.  During drowsiness and sleep, there is an increase in theta slowing with occasional vertex waves seen. Photic stimulation did not elicit any abnormalities.  There were occasional low to medium voltage irregular 4-5 Hz spike discharges in a generalized fashion.  There were no electrographic seizures seen.    EKG lead was unremarkable.  Impression: This awake and asleep EEG is abnormal  due to the presence of: 1. Mild diffuse slowing of the waking background 2. Occasional irregular generalized spike discharges  Clinical Correlation: The generalized epileptiform discharges suggest the potential for seizure disorder with generalized onset.  Mild diffuse slowing of the waking background indicates diffuse cerebral dysfunction that is non-specific in etiology and may be seen with toxic/metabolic encephalopathies, medication effect, or post-ictal state.  Clinical correlation is advised.   Patrcia DollyKaren Hulbert Branscome, M.D.

## 2014-02-27 NOTE — Progress Notes (Signed)
UR COMPLETED  

## 2014-02-28 ENCOUNTER — Encounter (HOSPITAL_COMMUNITY): Payer: Self-pay | Admitting: Behavioral Health

## 2014-02-28 ENCOUNTER — Inpatient Hospital Stay (HOSPITAL_COMMUNITY)
Admission: AD | Admit: 2014-02-28 | Discharge: 2014-03-07 | DRG: 885 | Disposition: A | Discharge status: Home / Self Care | Payer: 59 | Source: Intra-hospital | Attending: Psychiatry | Admitting: Psychiatry

## 2014-02-28 DIAGNOSIS — Z833 Family history of diabetes mellitus: Secondary | ICD-10-CM | POA: Diagnosis not present

## 2014-02-28 DIAGNOSIS — R45851 Suicidal ideations: Secondary | ICD-10-CM | POA: Diagnosis present

## 2014-02-28 DIAGNOSIS — F17220 Nicotine dependence, chewing tobacco, uncomplicated: Secondary | ICD-10-CM | POA: Diagnosis present

## 2014-02-28 DIAGNOSIS — G4700 Insomnia, unspecified: Secondary | ICD-10-CM | POA: Diagnosis present

## 2014-02-28 DIAGNOSIS — G8929 Other chronic pain: Secondary | ICD-10-CM | POA: Diagnosis present

## 2014-02-28 DIAGNOSIS — Z915 Personal history of self-harm: Secondary | ICD-10-CM

## 2014-02-28 DIAGNOSIS — F411 Generalized anxiety disorder: Secondary | ICD-10-CM | POA: Diagnosis present

## 2014-02-28 DIAGNOSIS — T50902D Poisoning by unspecified drugs, medicaments and biological substances, intentional self-harm, subsequent encounter: Secondary | ICD-10-CM

## 2014-02-28 DIAGNOSIS — F319 Bipolar disorder, unspecified: Secondary | ICD-10-CM | POA: Diagnosis present

## 2014-02-28 DIAGNOSIS — Z8249 Family history of ischemic heart disease and other diseases of the circulatory system: Secondary | ICD-10-CM | POA: Diagnosis not present

## 2014-02-28 DIAGNOSIS — F17210 Nicotine dependence, cigarettes, uncomplicated: Secondary | ICD-10-CM | POA: Diagnosis present

## 2014-02-28 DIAGNOSIS — F329 Major depressive disorder, single episode, unspecified: Secondary | ICD-10-CM | POA: Diagnosis present

## 2014-02-28 DIAGNOSIS — Z9151 Personal history of suicidal behavior: Secondary | ICD-10-CM

## 2014-02-28 DIAGNOSIS — F410 Panic disorder [episodic paroxysmal anxiety] without agoraphobia: Secondary | ICD-10-CM | POA: Diagnosis present

## 2014-02-28 DIAGNOSIS — F32A Depression, unspecified: Secondary | ICD-10-CM | POA: Diagnosis present

## 2014-02-28 DIAGNOSIS — F3130 Bipolar disorder, current episode depressed, mild or moderate severity, unspecified: Secondary | ICD-10-CM | POA: Diagnosis present

## 2014-02-28 DIAGNOSIS — R569 Unspecified convulsions: Secondary | ICD-10-CM

## 2014-02-28 DIAGNOSIS — F332 Major depressive disorder, recurrent severe without psychotic features: Secondary | ICD-10-CM

## 2014-02-28 LAB — TSH: TSH: 2.05 u[IU]/mL (ref 0.350–4.500)

## 2014-02-28 MED ORDER — ZIPRASIDONE HCL 40 MG PO CAPS
40.0000 mg | ORAL_CAPSULE | Freq: Two times a day (BID) | ORAL | Status: DC
  Administered 2014-02-28 – 2014-03-02 (×4): 40 mg via ORAL
  Filled 2014-02-28 (×8): qty 1

## 2014-02-28 MED ORDER — OXYCODONE HCL 5 MG PO TABS
5.0000 mg | ORAL_TABLET | Freq: Four times a day (QID) | ORAL | Status: DC | PRN
  Administered 2014-02-28: 5 mg via ORAL
  Filled 2014-02-28: qty 1

## 2014-02-28 MED ORDER — OXYCODONE HCL 5 MG PO TABS
5.0000 mg | ORAL_TABLET | ORAL | Status: DC | PRN
  Administered 2014-02-28 – 2014-03-07 (×32): 5 mg via ORAL
  Filled 2014-02-28 (×32): qty 1

## 2014-02-28 MED ORDER — ZOLPIDEM TARTRATE 10 MG PO TABS
10.0000 mg | ORAL_TABLET | Freq: Every evening | ORAL | Status: DC | PRN
  Administered 2014-02-28 – 2014-03-04 (×5): 10 mg via ORAL
  Filled 2014-02-28 (×5): qty 1

## 2014-02-28 MED ORDER — ALUM & MAG HYDROXIDE-SIMETH 200-200-20 MG/5ML PO SUSP: 30.0000 mL | ORAL | Status: DC | PRN

## 2014-02-28 MED ORDER — DIVALPROEX SODIUM 250 MG PO DR TAB: 750.0000 mg | DELAYED_RELEASE_TABLET | Freq: Two times a day (BID) | ORAL | Status: DC

## 2014-02-28 MED ORDER — DIVALPROEX SODIUM 500 MG PO DR TAB
750.0000 mg | DELAYED_RELEASE_TABLET | Freq: Two times a day (BID) | ORAL | Status: DC
  Administered 2014-02-28 – 2014-03-07 (×14): 750 mg via ORAL
  Filled 2014-02-28 (×24): qty 1

## 2014-02-28 MED ORDER — ACETAMINOPHEN 325 MG PO TABS: 650.0000 mg | ORAL_TABLET | Freq: Four times a day (QID) | ORAL | Status: DC | PRN

## 2014-02-28 MED ORDER — DIVALPROEX SODIUM 500 MG PO DR TAB
500.0000 mg | DELAYED_RELEASE_TABLET | Freq: Three times a day (TID) | ORAL | Status: DC
  Administered 2014-02-28: 500 mg via ORAL
  Filled 2014-02-28: qty 1

## 2014-02-28 MED ORDER — DIVALPROEX SODIUM 500 MG PO DR TAB
750.0000 mg | DELAYED_RELEASE_TABLET | Freq: Two times a day (BID) | ORAL | Status: DC
  Filled 2014-02-28: qty 1

## 2014-02-28 MED ORDER — DIVALPROEX SODIUM 250 MG PO DR TAB
250.0000 mg | DELAYED_RELEASE_TABLET | Freq: Once | ORAL | Status: DC
  Filled 2014-02-28: qty 1

## 2014-02-28 MED ORDER — NICOTINE POLACRILEX 2 MG MT GUM
2.0000 mg | CHEWING_GUM | OROMUCOSAL | Status: DC | PRN
  Administered 2014-02-28 – 2014-03-07 (×28): 2 mg via ORAL
  Filled 2014-02-28: qty 2
  Filled 2014-02-28 (×6): qty 1

## 2014-02-28 MED ORDER — BENZTROPINE MESYLATE 1 MG PO TABS
1.0000 mg | ORAL_TABLET | Freq: Two times a day (BID) | ORAL | Status: DC | PRN
  Administered 2014-03-01: 1 mg via ORAL
  Filled 2014-02-28: qty 1
  Filled 2014-02-28: qty 28

## 2014-02-28 MED ORDER — MAGNESIUM HYDROXIDE 400 MG/5ML PO SUSP: 30.0000 mL | Freq: Every day | ORAL | Status: DC | PRN

## 2014-02-28 MED ORDER — CLONAZEPAM 0.5 MG PO TABS: 0.5000 mg | ORAL_TABLET | Freq: Every day | ORAL | Status: DC
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
## 2014-02-28 NOTE — Discharge Summary (Addendum)
Physician Discharge Summary  Patient ID: [name] Boldon MRN: [id number] DOB/AGE: Mar 26, 1981 33 y.o.  Admit date: 02/24/2014 Discharge date: 02/28/2014  Primary Care Physician:  Aida PufferLITTLE,JAMES, MD  Discharge Diagnoses:    . Seroquel Drug overdose . MDD (major depressive disorder), recurrent severe, without psychosis . Chronic pain . Tachycardia Seizure likely due to Seroquel overdose  Consults: Neurology, Dr. Thad Rangereynolds                   Psychiatry, Dr. Elsie SaasJonnalagadda   Recommendations for Outpatient Follow-up:  EEG showed mild diffuse slowing with occasional irregular generalized spikes discharges. Neurology consult was obtained and recommended outpatient neurology followup, may need a repeat EEG. Followup appointment with Dr. Karel JarvisAquino  Allergies:   Allergies  Allergen Reactions  . Tylenol [Acetaminophen]     Upset stomach     Discharge Medications:   Medication List    STOP taking these medications       clonazePAM 0.5 MG tablet  Commonly known as:  KLONOPIN     FLUoxetine 20 MG capsule  Commonly known as:  PROZAC     QUEtiapine 200 MG tablet  Commonly known as:  SEROQUEL     QUEtiapine 50 MG tablet  Commonly known as:  SEROQUEL     SUBOXONE 2-0.5 MG Film  Generic drug:  Buprenorphine HCl-Naloxone HCl      TAKE these medications       divalproex 250 MG DR tablet  Commonly known as:  DEPAKOTE  Take 3 tablets (750 mg total) by mouth 2 (two) times daily.     etodolac 200 MG capsule  Commonly known as:  LODINE  Take 200 mg by mouth 5 (five) times daily.     oxycodone 5 MG capsule  Commonly known as:  OXY-IR  Take 5 mg by mouth 4 (four) times daily as needed for pain.     zolpidem 10 MG tablet  Commonly known as:  AMBIEN  Take 10 mg by mouth at bedtime as needed for sleep.       patient was also started on Geodon 40 mg twice a day by psychiatry.  Brief H and P: For complete details please refer to admission H and P, but in briefRichard Sullivan Lyndee Sullivan is a 33  y.o. male with Past medical history of depression, bipolar disorder, suicidal attempts, substance.  The patient presented with seizure episode. History was obtained from ED documentation as the patient was unable to provide significant history.  As per the ED patient's wife mentioned, that patient has been using excess dose of Seroquel since his recent discharge from the behavioral health.  He has prescription for 200 mg and 50 mg tablets of Seroquel which he refilled 8 days ago and is out of 80+ from both bottles.  As per the wife when she came from work to home she found that the patient was standing in the kitchen and suddenly collapsed on the floor and began shaking uncontrollably, there was tongue bite reported and patient remained unresponsive. Wife performed CPR for 4-6 minutes and called EMS. When the EMS arrived the patient was unresponsive and become more alert when they were attempting to insert a nasopharyngeal airway.  Patient remained tachycardic with no significant hypoxia and was able to protect his airway and they brought him to the ER.  Hospital Course:  [name] is a 33 y.o. male with Past medical history of depression, bipolar disorder, suicidal attempts, substance. The patient presented with seizure episode. As per  the ED patient's wife mentioned that patient has been using excess dose of Seroquel since his recent discharge from the behavioral health.  He has prescription for 200 mg and 50 mg tablets of Seroquel which he refilled 8 days ago and is out of 80+ from both bottles.  Drug overdose with Seroquel: Presenting with a seizure episode and unresponsive episode. Patient has had no issues during hospitalization and remained stable. Repeat EKG showed QTC 425, improved from admission EKG  Patient was placed on seizure precautions, suicidal precautions and safety sitter. Psychiatry was consulted and adjusted his medications. He was recommended inpatient psychiatry  admission. Patient is medically clear for discharge.  Seizure:  seizure episode likely due to Seroquel overdose, ambien (patient finished 10 mg Ambien prescription in 3 days, 10/2 received them - 10/4). Patient also reports that he takes Depakote for depression and mood stabilizing (not for seizures)  EEG done and showed mild diffuse slowing with occasional irregular generalized spike discharges. Patient likely has predisposition for seizures with a threshold lowered with Seroquel overdose. He was recommended to continue Depakote at 750 mg twice a day. He does not take Klonopin at home. He was cleared by neurology. I have made him an appointment with outpatient neurology on 03/21/14.   MDD (major depressive disorder), recurrent severe, without psychosis with history of prior suicide attempts  Psychiatry following  Chronic pain:  Patient reportedly takes oxycodone at home, prescribed by Dr. Clarene DukeLittle. Follow up outpatient with pain clinic and his PCP.  Tachycardia: Likely due to #1, dehydration, lactic acidosis - Resolved after fluid hydration   Day of Discharge BP 132/84  Pulse 80  Temp(Src) 98.1 F (36.7 Sullivan) (Oral)  Resp 19  Ht 5\' 8"  (1.727 m)  Wt 69 kg (152 lb 1.9 oz)  BMI 23.13 kg/m2  SpO2 98%  Physical Exam: General: Alert and awake oriented x3 not in any acute distress. CVS: S1-S2 clear no murmur rubs or gallops Chest: clear to auscultation bilaterally, no wheezing rales or rhonchi Abdomen: soft nontender, nondistended, normal bowel sounds Extremities: no cyanosis, clubbing or edema noted bilaterally Neuro: Cranial nerves II-XII intact, no focal neurological deficits   The results of significant diagnostics from this hospitalization (including imaging, microbiology, ancillary and laboratory) are listed below for reference.    LAB RESULTS: Basic Metabolic Panel:  Recent Labs Lab 02/25/14 0614 02/26/14 0740  NA 142 139  K 4.4 4.4  CL 106 105  CO2 23 23  GLUCOSE 90 92   BUN 8 14  CREATININE 0.79 0.77  CALCIUM 8.9 9.0   Liver Function Tests:  Recent Labs Lab 02/25/14 0255 02/25/14 0614  AST 14 15  ALT 10 10  ALKPHOS 87 86  BILITOT 0.3 0.2*  PROT 6.7 6.6  ALBUMIN 3.6 3.6   No results found for this basename: LIPASE, AMYLASE,  in the last 168 hours No results found for this basename: AMMONIA,  in the last 168 hours CBC:  Recent Labs Lab 02/25/14 0255 02/25/14 0614 02/26/14 0740  WBC 10.7* 9.5 8.5  NEUTROABS 6.4  --   --   HGB 14.5 14.3 14.3  HCT 41.4 41.0 41.1  MCV 94.3 93.6 93.4  PLT 292 278 274   Cardiac Enzymes:  Recent Labs Lab 02/25/14 0255  TROPONINI <0.30   BNP: No components found with this basename: POCBNP,  CBG:  Recent Labs Lab 02/24/14 1934  GLUCAP 92    Significant Diagnostic Studies:  No results found.     Disposition and Follow-up:  DISPOSITION: Behavioral health  DIET: Regular diet  DISCHARGE FOLLOW-UP Follow-up Information   Follow up with Van Clines, MD On 03/21/2014. (AT 10:30AM- neurology)    Specialty:  Neurology   Contact information:   [address] [place] [address] [phone]       Follow up with LITTLE,JAMES, MD. Schedule an appointment as soon as possible for a visit in 2 weeks. (for hospital follow-up)    Specialty:  Family Medicine   Contact information:   [address] [place] [address] [phone]       Time spent on Discharge: 40 mins  Signed:   Cuahutemoc Attar M.D. [facility] 02/28/2014, 12:24 PM Pager: [phone]

## 2014-02-28 NOTE — Clinical Social Work Psych Note (Signed)
Psych CS[facility].  [facility] will be [facility].   Pt is IVC'd and will be transported via [employer].  Psych CSW contacted [employer] regarding transportation. Lieutenant contacted psych CSW to notify that [employer] transportation has been dispatched and [facility] within the half hour.  Psych CSW notified RN.  MD and Charge RN at [facility] also updated.   RN to call report to [phone].  Vickii PennaGina Ebenezer Mccaskey, LCSWA [phone]  Psychiatric & Orthopedics (5N 1-16) Clinical Social Worker

## 2014-02-28 NOTE — Progress Notes (Signed)
Recreation Therapy Notes  Animal-Assisted Activity/Therapy (AAA/T) Program Checklist/Progress Notes Patient Eligibility Criteria Checklist & Daily Group note for Rec Tx Intervention  Date: 10.13.2015 Time: 2:45pm Location: [address] Dayroom   AAA/T Program Assumption of Risk Form signed by Patient/ or Parent Legal Guardian yes  Patient is free of allergies or sever asthma yes  Patient reports no fear of animals yes  Patient reports no history of cruelty to animals yes   Patient understands his/her participation is voluntary yes  Patient washes hands before animal contact yes  Patient washes hands after animal contact yes  Behavioral Response: Appropriate   Education: Hand Washing, Appropriate Animal Interaction   Education Outcome: Acknowledges education.   Clinical Observations/Feedback: Patient attended session, but stepped in and out consistently. While in group session patient was engaged and attentive, petting therapy dog appropriate, asking appropriate questions about therapy dog and his training and sharing stories about his pet at home.    Marykay Lexenise L Dorice Stiggers, LRT/CTRS  Jearl KlinefelterBlanchfield, Corrina Steffensen L 02/28/2014 4:52 PM

## 2014-02-28 NOTE — Progress Notes (Signed)
Pt will be discharge to behavior health. Report were call and [facility]. Discharge package were given to transportation.
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
## 2014-02-28 NOTE — Progress Notes (Signed)
Admission Note  D: [facility] Patient presents with flat, sad affect and depressed mood. He reported that he's been very depressed; his triggers are finances and deaths in the family. Pt. reported that he loss his mom a year ago and his grandfather passed five months ago; he verbalized that he had a close relationship with both relatives. Supportive wife and children.   A: Support and encouragement provided to patient. Oriented patient to the unit and informed him of the hospital's rules/policies. Initiated Q15 minute checks for safety.  R: Patient receptive. Denies SI/HI/AVH. Patient remains safe on the unit.
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
## 2014-02-28 NOTE — Progress Notes (Signed)
Pt attended AA meeting during evening group time. 

## 2014-02-28 NOTE — Progress Notes (Signed)
Subjective: No further seizures  Exam: Filed Vitals:   02/28/14 0945  BP: 132/84  Pulse: 80  Temp: 98.1 F (36.7 C)  Resp: 19   Gen: In bed, NAD MS: Awake, alert, oriented ZO:XWRUE<CN:PERRL< EOMI, VFF Motor: 5/5 throughout Sensory:intact to LT   Impression: 33 yo M with seizures in the setting of seroquel overdose. With a history of seizures and abnormal EEG, I would favor continued antiepilepticu therapy. Since he is already on depakote, I would favor increasing this dose as opposed to starting another AED. He has had his depakote increased to 500mg  TID due to a low level, but I feel that compliance may be better with BID dosing, so I have changed this to 750mg  BID.    Recommendations: 1) depakote 750mg  BID 2) He will need outpatient neurology follow up.  He can follow up with [facility] [address], [place], [place] [address]--[phone] or  [facility] [address], [place], [place] [address] [phone]) Neurology will sign off, please call with any further questions or concerns.    Ritta SlotMcNeill Hazel Leveille, MD [facility] [phone]  If 7pm- 7am, please page neurology on call as listed in AMION.

## 2014-03-01 ENCOUNTER — Encounter (HOSPITAL_COMMUNITY): Payer: Self-pay | Admitting: Psychiatry

## 2014-03-01 DIAGNOSIS — F411 Generalized anxiety disorder: Secondary | ICD-10-CM

## 2014-03-01 DIAGNOSIS — R45851 Suicidal ideations: Secondary | ICD-10-CM

## 2014-03-01 DIAGNOSIS — F3130 Bipolar disorder, current episode depressed, mild or moderate severity, unspecified: Secondary | ICD-10-CM | POA: Diagnosis present

## 2014-03-01 MED ORDER — NICOTINE POLACRILEX 2 MG MT GUM: 2.0000 mg | CHEWING_GUM | OROMUCOSAL | Status: DC | PRN

## 2014-03-01 MED ORDER — TRAZODONE HCL 100 MG PO TABS
100.0000 mg | ORAL_TABLET | Freq: Every evening | ORAL | Status: DC | PRN
  Administered 2014-03-01 – 2014-03-04 (×4): 100 mg via ORAL
  Filled 2014-03-01 (×2): qty 1
  Filled 2014-03-01: qty 14
  Filled 2014-03-01 (×2): qty 1

## 2014-03-01 MED ORDER — BENZOCAINE 10 % MT GEL
Freq: Four times a day (QID) | OROMUCOSAL | Status: DC | PRN
  Administered 2014-03-01: 20:00:00 via OROMUCOSAL
  Filled 2014-03-01: qty 9.4

## 2014-03-01 NOTE — [facility] Group Notes (Signed)
Adult Psychoeducational Group Note  Date:  03/01/2014 Time:  10:23 PM  Group Topic/Focus:  NA Meeting  Participation Level:  Minimal  Participation Quality:  Attentive  Affect:  Flat  Cognitive:  Appropriate  Insight: Limited  Engagement in Group:  Limited  Modes of Intervention:  Discussion and Education  Additional Comments:  Orrie attended group.  Caroll RancherLindsay, Caeden Foots A 03/01/2014, 10:23 PM

## 2014-03-01 NOTE — [facility] Group Notes (Signed)
[facility] LCSW Group Therapy  Emotional Regulation 1:15 - 2: 30 PM        03/01/2014     Type of Therapy:  Group Therapy  Participation Level:  Minimal  Participation Quality:  Appropriate  Affect:  Appropriate  Cognitive:   Appropriate  Insight:  Developing/Improving  Engagement in Therapy:  Developing/Improving   Modes of Intervention:  Discussion Exploration Problem-Solving Supportive  Summary of Progress/Problems:  Group topic was emotional regulations.  Patient listened and nodded in agreement with peers but did not engage in the discussion. Wynn BankerHodnett, Adelaine Roppolo Hairston 03/01/2014 1:48 PM

## 2014-03-01 NOTE — [facility] Suicide Risk Assessment (Signed)
[facility] INPATIENT:  Family/Significant Other Suicide Prevention Education  Suicide Prevention Education:  Patient Refusal for Family/Significant Other Suicide Prevention Education: The patient [name] has refused to provide written consent for family/significant other to be provided Family/Significant Other Suicide Prevention Education during admission and/or prior to discharge.  Physician notified.  Wynn BankerHodnett, Lovena Kluck Hairston 03/01/2014, 11:19 AM

## 2014-03-01 NOTE — BHH Counselor (Signed)
Adult Comprehensive Assessment  Patient ID: [name], male   DOB: [birth year], 33 y.o.   MRN: [id number]  Information Source: Information source: Patient  Current Stressors:  Educational / Learning stressors: None Employment / Job issues: Patient is disabled Family Relationships: None Surveyor, quantityinancial / Lack of resources (include bankruptcy): Could be better Housing / Lack of housing: None Physical health (include injuries & life threatening diseases): None Social relationships: None Substance abuse: None Bereavement / Loss: Patient reports mother, grandparents, and two inlaw grandparents have died over the past year  Living/Environment/Situation:  Living Arrangements: Spouse/significant other;Children Living conditions (as described by patient or guardian): Good How long has patient lived in current situation?: Five months What is atmosphere in current home: Comfortable;Loving;Supportive  Family History:  Marital status: Married Number of Years Married: 3 What types of issues is patient dealing with in the relationship?: No problems in the marriage Does patient have children?: Yes How many children?: 2 How is patient's relationship with their children?: Good relationship with young children  Childhood History:  By whom was/is the patient raised?: Mother Additional childhood history information: Good childhood Description of patient's relationship with caregiver when they were a child: Very good Patient's description of current relationship with people who raised him/her: Mother is deceased Does patient have siblings?: Yes Number of Siblings: 2 Description of patient's current relationship with siblings: Distant relationship with sister Did patient suffer any verbal/emotional/physical/sexual abuse as a child?: No Did patient suffer from severe childhood neglect?: No Has patient ever been sexually abused/assaulted/raped as an adolescent or adult?: No Was the patient ever a  victim of a crime or a disaster?: No Witnessed domestic violence?: No Has patient been effected by domestic violence as an adult?: No  Education:  Highest grade of school patient has completed: Tenth grade Currently a student?: No Learning disability?: Yes (ADD)  Employment/Work Situation:   Employment situation: On disability Why is patient on disability: Bipolar How long has patient been on disability: Three years Patient's job has been impacted by current illness: No What is the longest time patient has a held a job?: One year 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 Has patient ever been in the [employer] and Companymilitary?: No Has patient ever served in Buyer, retailcombat?: No  Financial Resources:   Financial resources: Insurance claims handlereceives SSDI Does patient have a Lawyerrepresentative payee or guardian?: No  Alcohol/Substance Abuse:   What has been your use of drugs/alcohol within the last 12 months?: None If attempted suicide, did drugs/alcohol play a role in this?: No Alcohol/Substance Abuse Treatment Hx: Denies past history Has alcohol/substance abuse ever caused legal problems?: No  Social Support System:   Forensic psychologistatient's Community Support System: None Describe Community Support System: N/A Type of faith/religion: None How does patient's faith help to cope with current illness?: N/A  Leisure/Recreation:   Leisure and Hobbies: Family man  Strengths/Needs:   What things does the patient do well?: Taking care of wife and children In what areas does patient struggle / problems for patient: Depression  Discharge Plan:   Does patient have access to transportation?: Yes Will patient be returning to same living situation after discharge?: Yes Currently receiving community mental health services: Yes (From Whom) ([facility] [place] If no, would patient like referral for services when discharged?: No Does patient have financial barriers related to discharge medications?:  No  Summary/Recommendations:  [name] is a 33 years old male admitted with Bipolar Disorder.  He will benefit from crisis stabilization, evaluation for medication,  psycho-education groups for coping skills development, group therapy and case management for discharge planning.      [name], Donald JulyQuylle Hairston. 03/01/2014

## 2014-03-01 NOTE — Progress Notes (Signed)
D: Pt presents with flat affect and depressed mood. Pt rates depression 7/10, hopeless 7/10 and depression 7/10. pt states that his depression is related to "life situations". Pt cautious and would not specify stressors. Pt c/o knee pain and was given prn meds as ordered at pts request. Pt compliant with taking meds and attending groups.  A: Medications administered as ordered per MD. Verbal support given. Pt encouraged to attend groups. 15 minute checks performed for safety.  R: Pt safety maintained at this time.

## 2014-03-01 NOTE — [facility] Suicide Risk Assessment (Signed)
Suicide Risk Assessment  Admission Assessment     Nursing information obtained from:    Demographic factors:    Current Mental Status:    Loss Factors:    Historical Factors:    Risk Reduction Factors:    Total Time spent with patient: 45 minutes  CLINICAL FACTORS:   Bipolar Disorder:   Depressive phase  COGNITIVE FEATURES THAT CONTRIBUTE TO RISK:  Polarized thinking Thought constriction (tunnel vision)    SUICIDE RISK:   Moderate:  Frequent suicidal ideation with limited intensity, and duration, some specificity in terms of plans, no associated intent, good self-control, limited dysphoria/symptomatology, some risk factors present, and identifiable protective factors, including available and accessible social support.  PLAN OF CARE: Supportive approach/coping skills/relapse prevention                               Reassess and optiize response to the psychotropics  I certify that inpatient services furnished can reasonably be expected to improve the patient's condition.  Mellina Benison A 03/01/2014, 4:46 PM

## 2014-03-01 NOTE — [facility] Group Notes (Signed)
[facility] Planning [facility] Note   03/01/2014 11:20 AM    Participation Quality:  Appropraite  Mood/Affect:  Appropriate  Depression Rating:  7  Anxiety Rating:  8  Thoughts of Suicide:  No  Will you contract for safety?   NA  Current AVH:  No  Plan for Discharge/Comments:  Patient attended discharge planning group and actively participated in group. He advised of having home, transportation and outpatient provider.  He is followed by Dr. [facility]ann[facility] for outpatient services.  Patient reports deaths of multiple family members over the past year as his stressor.  Transportation Means: Patient has transportation.   Supports:  Patient has a support system.   [name], [name]
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
## 2014-03-02 MED ORDER — ZIPRASIDONE HCL 60 MG PO CAPS
60.0000 mg | ORAL_CAPSULE | Freq: Two times a day (BID) | ORAL | Status: DC
  Administered 2014-03-02 – 2014-03-05 (×6): 60 mg via ORAL
  Filled 2014-03-02 (×10): qty 1

## 2014-03-02 MED ORDER — ACETAMINOPHEN 325 MG PO TABS
650.0000 mg | ORAL_TABLET | Freq: Four times a day (QID) | ORAL | Status: DC | PRN
  Administered 2014-03-02 – 2014-03-04 (×6): 650 mg via ORAL
  Filled 2014-03-02 (×6): qty 2

## 2014-03-02 NOTE — BHH Group Notes (Signed)
The focus of this group is to educate the patient on the purpose and policies of crisis stabilization and provide a format to answer questions about their admission.  The group details unit policies and expectations of patients while admitted. Patient attended this group. 

## 2014-03-02 NOTE — Progress Notes (Signed)
Adult Psychoeducational Group Note  Date:  03/02/2014 Time:  10:39 PM  Group Topic/Focus:  Wrap-Up Group:   The focus of this group is to help patients review their daily goal of treatment and discuss progress on daily workbooks.  Participation Level:  Active  Participation Quality:  Appropriate  Affect:  Appropriate  Cognitive:  Alert  Insight: Good  Engagement in Group:  Engaged  Modes of Intervention:  Discussion  Additional Comments:  Pt stated that he "had an all right day today". His goal was to get his medications adjusted so he can back home to his children. He was appropriate and engaged during group.   [place]ford Shihomas, Ysabella Babiarz K 03/02/2014, 10:39 PM

## 2014-03-02 NOTE — B[facility] Group Notes (Signed)
[facility] LCSW Group Therapy  [facility] 1:15 - 2:30 PM  03/02/2014   Type of Therapy:  Group Therapy  Participation Level: Active  Participation Quality:  Attentive  Affect:  Appropriate  Cognitive:  Appropriate  Insight:  Developing/Improving   Engagement in Therapy:  Developing/Improving   Modes of Intervention:  Discussion, Education, Exploration, Problem-Solving, Rapport Building, Support   Summary of Progress/Problems:   Patient was attentive to speaker from the [facility] as he shared his story of dealing with mental health/substance abuse issues and overcoming it by working a recovery program.  Patient expressed interest in their programs and services and received information on their agency.  He shared he plans to become involved with [facility] and hopes to someday return to [facility] as a speaker.  [name], [name] 03/02/2014

## 2014-03-02 NOTE — Progress Notes (Signed)
Pt reports he is doing better this evening.  He says he has been going to groups and participating.  He has been observed interacting appropriately with peers.  Pt remembers Clinical research associatewriter from previous admissions some time ago and spent time talking about how his life had changed in the past few years.  He reported that he was happily married with two small children.  He says that the reason he is here is that he "somehow" overdosed on his seroquel and had a seizure.  He wife found him and did CPR on him, then he was taken to the ED.  He denies SI/HI/AV.  His main complaint is his chronic hip/knee pain which he has had for years.  He says he takes his pain medications as ordered.  He has been asking for his pain med on the unit at appropriate times.  Pt is pleasant/cooperative with staff this evening. Support and encouragement offered.  Pt plans to return home at discharge.  Safety maintained with q15 minute checks.

## 2014-03-02 NOTE — Progress Notes (Signed)
[facility]  03/02/2014 11:49 AM [name]  MRN:  [id number] Subjective:  Donald BurdockRichard states that he is still not feeling right. He feels his mood is still unstable. He states he has not seen much benefit from the Geodon yet. He states that the Seroquel was making him feel worst and that his wife had noticed this and make comments about it. He tried Abilify and says it did not work. Still endorsing suicidal ideations on and off. "cant help it" Still thinks that he is dealing with all the losses and that it is "like a soda that you agitate open up and everything comes out." Did sleep some better last night Diagnosis:   DSM5: Depressive Disorders:  Major Depressive Disorder - Severe (296.23) Total Time spent with patient: 30 minutes  Axis I: Bipolar, Depressed  ADL's:  Intact  Sleep: Fair  Appetite:  Fair Psychiatric Specialty Exam: Physical Exam  Review of Systems  Constitutional: Negative.   Eyes: Negative.   Respiratory: Negative.   Cardiovascular: Negative.   Gastrointestinal: Negative.   Genitourinary: Negative.   Musculoskeletal: Positive for back pain and joint pain.  Skin: Negative.   Neurological: Positive for headaches.  Endo/Heme/Allergies: Negative.   Psychiatric/Behavioral: Positive for depression and suicidal ideas. The patient is nervous/anxious and has insomnia.     Blood pressure 130/78, pulse 94, temperature 97.7 F (36.5 C), temperature source Oral, resp. rate 18, height 5\' 8"  (1.727 m), weight 67.586 kg (149 lb).Body mass index is 22.66 kg/(m^2).  General Appearance: Fairly Groomed  Patent attorneyye Contact::  Fair  Speech:  Clear and Coherent and Slow  Volume:  Decreased  Mood:  Depressed  Affect:  Restricted  Thought Process:  Coherent and Goal Directed  Orientation:  Full (Time, Place, and Person)  Thought Content:  symptoms events lossess "numb"  Suicidal Thoughts:  Yes.  without intent/plan  Homicidal Thoughts:  No  Memory:  Immediate;   Fair Recent;    Fair Remote;   Fair  Judgement:  Fair  Insight:  Present  Psychomotor Activity:  Decreased  Concentration:  Fair  Recall:  FiservFair  Fund of Knowledge:NA  Language: Fair  Akathisia:  No  Handed:    AIMS (if indicated):     Assets:  Desire for Improvement Housing Social Support  Sleep:  Number of Hours: 6.5   Musculoskeletal: Strength & Muscle Tone: within normal limits Gait & Station: normal Patient leans: N/A  Current Medications: Current Facility-Administered Medications  Medication Dose Route Frequency Provider Last Rate Last Dose  . alum & mag hydroxide-simeth (MAALOX/MYLANTA) 200-200-20 MG/5ML suspension 30 mL  30 mL Oral Q4H PRN Lindwood QuaSheila May Agustin, NP      . benzocaine (ORAJEL) 10 % mucosal gel   Mouth/Throat QID PRN Rachael FeeIrving A Ruthmary Occhipinti, MD      . benztropine (COGENTIN) tablet 1 mg  1 mg Oral BID PRN Rachael FeeIrving A Ilija Maxim, MD   1 mg at 03/01/14 0804  . divalproex (DEPAKOTE) DR tablet 750 mg  750 mg Oral Q12H Lindwood QuaSheila May Agustin, NP   750 mg at 03/02/14 0753  . magnesium hydroxide (MILK OF MAGNESIA) suspension 30 mL  30 mL Oral Daily PRN Velna HatchetSheila May Agustin, NP      . nicotine polacrilex (NICORETTE) gum 2 mg  2 mg Oral PRN Nehemiah MassedFernando Cobos, MD   2 mg at 03/02/14 1016  . oxyCODONE (Oxy IR/ROXICODONE) immediate release tablet 5 mg  5 mg Oral Q4H PRN Rachael FeeIrving A Mashonda Broski, MD   5 mg  at 03/02/14 1015  . traZODone (DESYREL) tablet 100 mg  100 mg Oral QHS PRN Rachael FeeIrving A Cylis Ayars, MD   100 mg at 03/01/14 2134  . ziprasidone (GEODON) capsule 60 mg  60 mg Oral BID WC Rachael FeeIrving A Yamilett Anastos, MD      . zolpidem [facility](AMBIEN) tablet 10 mg  10 mg Oral QHS PRN Rachael FeeIrving A Danni Leabo, MD   10 mg at 03/01/14 2204    Lab Results:  Results for orders placed during the hospital encounter of 02/28/14 (from the past 48 hour(s))  TSH     Status: None   Collection Time    02/28/14  7:30 PM      Result Value Ref Range   TSH 2.050  0.350 - 4.500 uIU/mL   Comment: [facility]    Physical Findings: AIMS: Facial and Oral  Movements Muscles of Facial Expression: None, normal Lips and Perioral Area: None, normal Jaw: None, normal Tongue: None, normal,Extremity Movements Upper (arms, wrists, hands, fingers): None, normal Lower (legs, knees, ankles, toes): None, normal, Trunk Movements Neck, shoulders, hips: None, normal, Overall Severity Severity of abnormal movements (highest score from questions above): None, normal Incapacitation due to abnormal movements: None, normal Patient's awareness of abnormal movements (rate only patient's report): No Awareness,    CIWA:    COWS:     Treatment Plan Summary: Daily contact with patient to assess and evaluate symptoms and progress in treatment Medication management  Plan: Supportive approach/coping skills           Will increase the Geodon to 60 mg BID           Will remove Tylenol as an allergy as he states he takes Tylenol at home without any                    problems.    Medical Decision Making Problem Points:  Review of psycho-social stressors (1) Data Points:  Review of medication regiment & side effects (2) Review of new medications or change in dosage (2)  I certify that inpatient services furnished can reasonably be expected to improve the patient's condition.   Zebbie Ace A 03/02/2014, 11:49 AM

## 2014-03-03 MED ORDER — FLUOXETINE HCL 20 MG PO CAPS
40.0000 mg | ORAL_CAPSULE | Freq: Every day | ORAL | Status: DC
  Administered 2014-03-03 – 2014-03-04 (×2): 40 mg via ORAL
  Filled 2014-03-03 (×5): qty 2

## 2014-03-03 NOTE — Plan of Care (Signed)
Problem: Alteration in mood Goal: LTG-Pt's behavior demonstrates decreased signs of depression (Patient's behavior demonstrates decreased signs of depression to the point the patient is safe to return home and continue treatment in an outpatient setting)  Outcome: Progressing Pt denies SI and is interacting appropriately with peer

## 2014-03-03 NOTE — [facility] Group Notes (Signed)
[facility] LCSW Group Therapy  Feelings Around Diagnosis  03/03/2014 3:20 PM  Type of Therapy:  Group Therapy  Participation Level:  Did Not Attend  Wynn BankerHodnett, Jazmin Vensel Hairston 03/03/2014, 3:20 PM

## 2014-03-03 NOTE — Progress Notes (Signed)
[facility]  03/03/2014 1:41 PM [name]  MRN:  [id number] Subjective:  Donald BurdockRichard states that he is feeling more depressed today. States he had a dream about his grandfather last night. States it is getting closer to the anniversary of his death. States he cant explain why is he feeling like this. States he wants to think positive, think about his wife and kids but that he cant. Still with occasional thoughts of suicide what scares him Diagnosis:   DSM5: Depressive Disorders:  Major Depressive Disorder - Moderate (296.22) Total Time spent with patient: 30 minutes  Axis I: Bipolar, Depressed  ADL's:  Intact  Sleep: Fair  Appetite:  Fair  Psychiatric Specialty Exam: Physical Exam  Review of Systems  Constitutional: Positive for malaise/fatigue.  Eyes: Negative.   Respiratory: Negative.   Cardiovascular: Negative.   Gastrointestinal: Negative.   Genitourinary: Negative.   Musculoskeletal: Positive for back pain and joint pain.  Skin: Negative.   Neurological: Positive for weakness and headaches.  Endo/Heme/Allergies: Negative.   Psychiatric/Behavioral: Positive for depression. The patient is nervous/anxious.     Blood pressure 127/79, pulse 88, temperature 97.7 F (36.5 C), temperature source Oral, resp. rate 14, height 5\' 8"  (1.727 m), weight 67.586 kg (149 lb).Body mass index is 22.66 kg/(m^2).  General Appearance: Fairly Groomed  Patent attorneyye Contact::  Fair  Speech:  Clear and Coherent, Slow and not spontaneous  Volume:  Decreased  Mood:  Depressed  Affect:  Restricted  Thought Process:  Coherent and Goal Directed  Orientation:  Full (Time, Place, and Person)  Thought Content:  symptoms worries concerns  Suicidal Thoughts:  intermittent  Homicidal Thoughts:  No  Memory:  Immediate;   Fair Recent;   Fair Remote;   Fair  Judgement:  Fair  Insight:  Present  Psychomotor Activity:  Decreased  Concentration:  Fair  Recall:  FiservFair  Fund of Knowledge:NA  Language:  Fair  Akathisia:  No  Handed:    AIMS (if indicated):     Assets:  Desire for Improvement Housing Social Support  Sleep:  Number of Hours: 5   Musculoskeletal: Strength & Muscle Tone: within normal limits Gait & Station: normal Patient leans: N/A  Current Medications: Current Facility-Administered Medications  Medication Dose Route Frequency Provider Last Rate Last Dose  . acetaminophen (TYLENOL) tablet 650 mg  650 mg Oral Q6H PRN Rachael FeeIrving A Shanan Fitzpatrick, MD   650 mg at 03/03/14 [id number]  . alum & mag hydroxide-simeth (MAALOX/MYLANTA) 200-200-20 MG/5ML suspension 30 mL  30 mL Oral Q4H PRN Lindwood QuaSheila May Agustin, NP      . benzocaine (ORAJEL) 10 % mucosal gel   Mouth/Throat QID PRN Rachael FeeIrving A Laruth Hanger, MD      . benztropine (COGENTIN) tablet 1 mg  1 mg Oral BID PRN Rachael FeeIrving A Altin Sease, MD   1 mg at 03/01/14 0804  . divalproex (DEPAKOTE) DR tablet 750 mg  750 mg Oral Q12H Lindwood QuaSheila May Agustin, NP   750 mg at 03/03/14 0820  . FLUoxetine (PROZAC) capsule 40 mg  40 mg Oral Daily Rachael FeeIrving A Sharena Dibenedetto, MD   40 mg at 03/03/14 1231  . magnesium hydroxide (MILK OF MAGNESIA) suspension 30 mL  30 mL Oral Daily PRN Velna HatchetSheila May Agustin, NP      . nicotine polacrilex (NICORETTE) gum 2 mg  2 mg Oral PRN Nehemiah MassedFernando Cobos, MD   2 mg at 03/03/14 [id number]  . oxyCODONE (Oxy IR/ROXICODONE) immediate release tablet 5 mg  5 mg Oral Q4H PRN Madie RenoIrving  Jorja LoaA Haitham Dolinsky, MD   5 mg at 03/03/14 1042  . traZODone (DESYREL) tablet 100 mg  100 mg Oral QHS PRN Rachael FeeIrving A Seymour Pavlak, MD   100 mg at 03/02/14 2200  . ziprasidone (GEODON) capsule 60 mg  60 mg Oral BID WC Rachael FeeIrving A Duval Macleod, MD   60 mg at 03/03/14 0820  . zolpidem (AMBIEN) tablet 10 mg  10 mg Oral QHS PRN Rachael FeeIrving A Masao Junker, MD   10 mg at 03/02/14 2159    Lab Results: No results found for this or any previous visit (from the past 48 hour(s)).  Physical Findings: AIMS: Facial and Oral Movements Muscles of Facial Expression: None, normal Lips and Perioral Area: None, normal Jaw: None, normal Tongue: None, normal,Extremity  Movements Upper (arms, wrists, hands, fingers): None, normal Lower (legs, knees, ankles, toes): None, normal, Trunk Movements Neck, shoulders, hips: None, normal, Overall Severity Severity of abnormal movements (highest score from questions above): None, normal Incapacitation due to abnormal movements: None, normal Patient's awareness of abnormal movements (rate only patient's report): No Awareness, Dental Status Current problems with teeth and/or dentures?: No Does patient usually wear dentures?: No  CIWA:    COWS:     Treatment Plan Summary: Daily contact with patient to assess and evaluate symptoms and progress in treatment Medication management  Plan: Supportive approach/coping skills           CBT;mindfulness           Continue but reassess the use of Geodon           Resume the Prozac that he was using but did not make to his current medications           Prozac 40 mg today with plans to increase to Prozac 60 mg tomorrow ( his regular dose)           Grief loss  Medical Decision Making Problem Points:  Review of psycho-social stressors (1) Data Points:  Review of medication regiment & side effects (2)  I certify that inpatient services furnished can reasonably be expected to improve the patient's condition.   Shakyia Bosso A 03/03/2014, 1:41 PM

## 2014-03-03 NOTE — Tx Team (Addendum)
Interdisciplinary Treatment Plan Update   Date Reviewed:  03/03/2014  Time Reviewed:  8:30 AM  Progress in Treatment:   Attending groups: Yes Participating in groups: Yes Taking medication as prescribed: Yes  Tolerating medication: Yes Family/Significant other contact made:  No, patient declined collateral contact Patient understands diagnosis: Yes, Yes, patient understands diagnosis and need for treatment. Discussing patient identified problems/goals with staff: Yes, Yes, patient is able to express goals for treatment and discharge. Medical problems stabilized or resolved: Yes Denies suicidal/homicidal ideation: Yes Patient has not harmed self or others: Yes  For review of initial/current patient goals, please see plan of care.  Estimated Length of Stay:  3-4 days  Reasons for Continued Hospitalization:    New Problems/Goals identified:    Discharge Plan or Barriers:   Home with outpatient follow up [facility]  Additional Comments:  MD increasing Geodon  Patient and CSW reviewed patient's identified goals and treatment plan.  Patient verbalized understanding and agreed to treatment plan.   Attendees:  Patient:  03/03/2014 8:30 AM   Signature:  Sallyanne HaversF. Cobos, MD 03/03/2014 8:30 AM  Signature: Geoffery LyonsIrving Lugo, MD 03/03/2014 8:30 AM  Signature: Genelle GatherPatti Dukes, RN 03/03/2014 8:30 AM  Signature: Lamount Crankerhris Judge, RN 03/03/2014 8:30 AM  Signature:  Robbie LouisVivian Kent, RN 03/03/2014 8:30 AM  Signature:  Donald PatchQuylle Jaree Trinka, LCSW 03/03/2014 8:30 AM  Signature:  03/03/2014 8:30 AM  Signature:  Cleopatra Cedareggie King, [facility] Team 03/03/2014 8:30 AM  Signature:     03/03/2014 8:30 AM  Signature: 03/03/2014  8:30 AM  Signature:   Onnie BoerJennifer Clark, RN URCM 03/03/2014  8:30 AM  Signature:  Santa GeneraAnne Cunningham Lead Social Worker LCSW 03/03/2014  8:30 AM    Scribe for Treatment Team:   Chesapeake EnergyQuylle Darcus Edds,  03/03/2014 8:30 AM

## 2014-03-03 NOTE — Progress Notes (Signed)
Patient ID: [name], male   DOB: 1981-02-05, 33 y.o.   MRN: [id number] D: Patient in dayroom on approach. Pt observed interacting well with peers. Pt presents with flat affect and depressed mood.  Pt denies SI/HI/AVH and pain. Cooperative with assessment. No acute distressed noted at this time.   A: Met with pt 1:1. Medications administered as prescribed. Writer encouraged pt to discuss feelings. Pt encouraged to come to staff with any question or concerns.   R: Patient remains safe. He is complaint with medications and denies any adverse reaction. Continue current POC.

## 2014-03-03 NOTE — [facility] Group Notes (Signed)
[facility]   03/03/2014 1:14 PM    Participation Quality:  Appropraite  Mood/Affect:  Appropriate  Depression Rating:  7  Anxiety Rating:  0  Thoughts of Suicide:  No  Will you contract for safety?   NA  Current AVH:  No  Plan for Discharge/Comments:  Patient attended discharge planning group and actively participated in group. Patient will return to his home and follow up with [facility].   Transportation Means: Patient has transportation.   Supports:  Patient has a support system.   Elizabeth Haff, Joesph JulyQuylle Hairston

## 2014-03-03 NOTE — Progress Notes (Signed)
The patient attended the A. A. Meeting and was appropriate. The patient verbalized that he would like to remain off of drugs and that he has attended meetings in the past.

## 2014-03-03 NOTE — Progress Notes (Signed)
D) Pt states that he is not feeling well today either physically or emotionally. Having some nasal congestion and cold allergy symptoms and feeling depressed over his many losses. Continues to have pain in his back and requests his pain medications every 4 hours. Has been sleeping a good part of this shift due to not feeling well. Rates his depression at a 5, hopelessness at a 5 and his anxiety at a 3. A) Given support, reassurance and praise. Encouragement given. Provided with a 1:1. R) Denies SI and HI. States "I just don't feel very well today".

## 2014-03-04 MED ORDER — FLUOXETINE HCL 20 MG PO CAPS
60.0000 mg | ORAL_CAPSULE | Freq: Every day | ORAL | Status: DC
  Administered 2014-03-05: 60 mg via ORAL
  Filled 2014-03-04 (×4): qty 3

## 2014-03-04 MED ORDER — MENTHOL 3 MG MT LOZG
1.0000 | LOZENGE | OROMUCOSAL | Status: DC | PRN
  Administered 2014-03-04 (×3): 3 mg via ORAL

## 2014-03-04 NOTE — Progress Notes (Signed)
D: Pt has anxious affect, depressed mood.  Pt reports his goal for the day was "to make it to all the groups."  Pt denies SI/HI, denies hallucinations.  Pt interacts with peers and staff appropriately and attended evening group. A: Pt received PRN medication for insomnia and pain, see flowsheet.  Educated pt on PRN medications for pain and provided pt with hand outs.  Met with pt 1:1 and provided encouragement and support.  Safety maintained. R: Pt is in no distress and verbally contracts for safety.  Will continue to monitor and assess pt for safety.

## 2014-03-04 NOTE — [facility] Group Notes (Signed)
[facility] Group Notes:  (Clinical Social Work)  03/04/2014     10-11AM  Summary of Progress/Problems:   The main focus of today's process group was to learn how to use a decisional balance exercise to move forward in the Stages of Change, which were described and discussed.  Motivational Interviewing and a worksheet were utilized to help patients explore in depth the perceived benefits and costs of a self-sabotaging behavior, as well as the  benefits and costs of replacing that with a healthy coping mechanism.   The patient was late to group, but participated intently while present, showing insight.    Type of Therapy:  Group Therapy - Process   Participation Level:  Active  Participation Quality:  Attentive and Sharing  Affect:  Depressed  Cognitive:  Alert and Oriented  Insight:  Engaged  Engagement in Therapy:  Engaged  Modes of Intervention:  Education, Motivational Interviewing  Ambrose MantleMareida Grossman-Orr, LCSW 03/04/2014, 12:39 PM

## 2014-03-04 NOTE — Progress Notes (Signed)
D) Pt up and out in the milieu early this morning. Pleasant and cooperative. Continues to complain of back pain and in the morning of a headache. Pt attending all the groups and interacting with his peers appropriately. Pt denies SI and HI. Talked about his family and stated "I am lucky to have the family that I have. Rates his depression and hopelessness both at a 5 and rates his anxiety at a 0. A) Provided with a 1:1. Given support and reassurance along with praise. Encouragement given and therapeutic humor used. R) Pt denies SI and HI.

## 2014-03-04 NOTE — Progress Notes (Signed)
Patient did attend the evening speaker AA meeting.  

## 2014-03-04 NOTE — Plan of Care (Signed)
Problem: Diagnosis: Increased Risk For Suicide Attempt Goal: STG-Patient Will Attend All Groups On The Unit Outcome: Progressing Patient attended group this evening     

## 2014-03-04 NOTE — Plan of Care (Signed)
Problem: Alteration in mood Goal: STG-Patient reports thoughts of self-harm to staff Outcome: Progressing Patient denies suicidal thoughts and agrees to let staff know if thoughts occur.

## 2014-03-04 NOTE — Plan of Care (Signed)
Problem: Ineffective individual coping Goal: STG: Patient will remain free from self harm Outcome: Progressing Pt has not harmed himself this shift.    Problem: Alteration in mood Goal: LTG-Patient reports reduction in suicidal thoughts (Patient reports reduction in suicidal thoughts and is able to verbalize a safety plan for whenever patient is feeling suicidal)  Outcome: Progressing Pt denied SI this shift and agreed to notify staff if he thinks of harming himself.    Problem: Diagnosis: Increased Risk For Suicide Attempt Goal: STG-Patient Will Attend All Groups On The Unit Outcome: Progressing Pt attended evening group on 03/03/2014.

## 2014-03-04 NOTE — Progress Notes (Signed)
[facility]  03/04/2014 1:59 PM [name]  MRN:  [id number] Subjective:  Donald BurdockRichard states that he is feeling much better today and less depressed. Upon discharge he plans to return home. Counseling sessions and meetings have been arranged with Dr. Mervyn SkeetersA and therapist per the patient. Currently rates his depression 5-7/10, anxiety 7/10, and hopelessness 5/10. He continues to endorse high levels of anxiety at this time, due to one of his peers disrupting the milieu on the unit. He is actively participating all groups on the unit, and he has made a vow to himself to stay awake all day. He denies SI/HI/AVH.  Diagnosis:   DSM5: Depressive Disorders:  Major Depressive Disorder - Moderate (296.22) Total Time spent with patient: 30 minutes  Axis I: Bipolar, Depressed  ADL's:  Intact  Sleep: Fair  Appetite:  Fair  Psychiatric Specialty Exam: Physical Exam   Review of Systems  Constitutional: Positive for malaise/fatigue.  Eyes: Negative.   Respiratory: Negative.   Cardiovascular: Negative.   Gastrointestinal: Negative.   Genitourinary: Negative.   Musculoskeletal: Positive for back pain and joint pain.  Skin: Negative.   Neurological: Positive for weakness and headaches.  Endo/Heme/Allergies: Negative.   Psychiatric/Behavioral: Positive for depression. The patient is nervous/anxious.     Blood pressure 127/79, pulse 88, temperature 97.7 F (36.5 C), temperature source Oral, resp. rate 14, height 5\' 8"  (1.727 m), weight 67.586 kg (149 lb).Body mass index is 22.66 kg/(m^2).  General Appearance: Fairly Groomed  Patent attorneyye Contact::  Fair  Speech:  Clear and Coherent, Slow and not spontaneous  Volume:  Decreased  Mood:  Depressed  Affect:  Restricted  Thought Process:  Coherent and Goal Directed  Orientation:  Full (Time, Place, and Person)  Thought Content:  symptoms worries concerns  Suicidal Thoughts:  intermittent  Homicidal Thoughts:  No  Memory:  Immediate;   Fair Recent;    Fair Remote;   Fair  Judgement:  Fair  Insight:  Present  Psychomotor Activity:  Decreased  Concentration:  Fair  Recall:  FiservFair  Fund of Knowledge:NA  Language: Fair  Akathisia:  No  Handed:    AIMS (if indicated):     Assets:  Desire for Improvement Housing Social Support  Sleep:  Number of Hours: 6   Musculoskeletal: Strength & Muscle Tone: within normal limits Gait & Station: normal Patient leans: N/A  Current Medications: Current Facility-Administered Medications  Medication Dose Route Frequency Provider Last Rate Last Dose  . acetaminophen (TYLENOL) tablet 650 mg  650 mg Oral Q6H PRN Rachael FeeIrving A Lugo, MD   650 mg at 03/04/14 1002  . alum & mag hydroxide-simeth (MAALOX/MYLANTA) 200-200-20 MG/5ML suspension 30 mL  30 mL Oral Q4H PRN Lindwood QuaSheila May Agustin, NP      . benzocaine (ORAJEL) 10 % mucosal gel   Mouth/Throat QID PRN Rachael FeeIrving A Lugo, MD      . benztropine (COGENTIN) tablet 1 mg  1 mg Oral BID PRN Rachael FeeIrving A Lugo, MD   1 mg at 03/01/14 0804  . divalproex (DEPAKOTE) DR tablet 750 mg  750 mg Oral Q12H Lindwood QuaSheila May Agustin, NP   750 mg at 03/04/14 [id number]  . FLUoxetine (PROZAC) capsule 40 mg  40 mg Oral Daily Rachael FeeIrving A Lugo, MD   40 mg at 03/04/14 [id number]  . magnesium hydroxide (MILK OF MAGNESIA) suspension 30 mL  30 mL Oral Daily PRN Velna HatchetSheila May Agustin, NP      . nicotine polacrilex (NICORETTE) gum 2 mg  2 mg  Oral PRN Nehemiah MassedFernando Cobos, MD   2 mg at 03/04/14 0808  . oxyCODONE (Oxy IR/ROXICODONE) immediate release tablet 5 mg  5 mg Oral Q4H PRN Rachael FeeIrving A Lugo, MD   5 mg at 03/04/14 1159  . traZODone (DESYREL) tablet 100 mg  100 mg Oral QHS PRN Rachael FeeIrving A Lugo, MD   100 mg at 03/03/14 2233  . ziprasidone (GEODON) capsule 60 mg  60 mg Oral BID WC Rachael FeeIrving A Lugo, MD   60 mg at 03/04/14 0809  . zolpidem (AMBIEN) tablet 10 mg  10 mg Oral QHS PRN Rachael FeeIrving A Lugo, MD   10 mg at 03/03/14 2233    Lab Results: No results found for this or any previous visit (from the past 48 hour(s)).  Physical  Findings: AIMS: Facial and Oral Movements Muscles of Facial Expression: None, normal Lips and Perioral Area: None, normal Jaw: None, normal Tongue: None, normal,Extremity Movements Upper (arms, wrists, hands, fingers): None, normal Lower (legs, knees, ankles, toes): None, normal, Trunk Movements Neck, shoulders, hips: None, normal, Overall Severity Severity of abnormal movements (highest score from questions above): None, normal Incapacitation due to abnormal movements: None, normal Patient's awareness of abnormal movements (rate only patient's report): No Awareness, Dental Status Current problems with teeth and/or dentures?: No Does patient usually wear dentures?: No  CIWA:    COWS:     Treatment Plan Summary: Daily contact with patient to assess and evaluate symptoms and progress in treatment Medication management  Plan: Supportive approach/coping skills           CBT;mindfulness           Continue but reassess the use of Geodon           Resume the Prozac that he was using but did not make to his current medications           Will increase to Prozac 60 mg tomorrow per AfghanistanLugo.  Medical Decision Making Problem Points:  Review of psycho-social stressors (1) Data Points:  Review of medication regiment & side effects (2)  I certify that inpatient services furnished can reasonably be expected to improve the patient's condition.   Malachy ChamberSTARKES, TAKIA S FNP-BC 03/04/2014, 1:59 PM  Reviewed the information documented and agree with the treatment plan.  Vyncent Overby,JANARDHAHA R. 03/05/2014 2:14 PM

## 2014-03-04 NOTE — Progress Notes (Signed)
Patient has been up and active on the unit, attended group this evening and has voiced complaints of knee and hip pain. Patient is compliant with scheduled medications. He plans to see a therapist after discharge and return home with his family.Patient currently denies si/hi/a/v hall. Support and encouragement offered, safety maintained on unit, will continue to monitor.

## 2014-03-05 MED ORDER — ZIPRASIDONE HCL 80 MG PO CAPS
80.0000 mg | ORAL_CAPSULE | Freq: Two times a day (BID) | ORAL | Status: DC
  Administered 2014-03-05 – 2014-03-07 (×4): 80 mg via ORAL
  Filled 2014-03-05: qty 28
  Filled 2014-03-05: qty 1
  Filled 2014-03-05: qty 28
  Filled 2014-03-05 (×4): qty 1
  Filled 2014-03-05: qty 2
  Filled 2014-03-05: qty 1
  Filled 2014-03-05 (×2): qty 28

## 2014-03-05 MED ORDER — FLUOXETINE HCL 20 MG PO CAPS
40.0000 mg | ORAL_CAPSULE | Freq: Every day | ORAL | Status: DC
  Administered 2014-03-06 – 2014-03-07 (×2): 40 mg via ORAL
  Filled 2014-03-05: qty 2
  Filled 2014-03-05 (×2): qty 28
  Filled 2014-03-05 (×2): qty 2

## 2014-03-05 NOTE — [facility] Group Notes (Signed)
[facility] Group Notes:  (Nursing/MHT/Case Management/Adjunct)  Date:  03/05/2014  Time:  3:00 PM  Type of Therapy:  Psychoeducational Skills  Participation Level:  Active  Participation Quality:  Appropriate  Affect:  Appropriate  Cognitive:  Appropriate  Insight:  Appropriate  Engagement in Group:  Engaged  Modes of Intervention:  Discussion  Summary of Progress/Problems: Pt did attend self inventory group, pt reported that he was negative SI/HI, no AH/VH noted. Pt rated his depression as a 5, and his helplessness/hopelessness as a 0.     Pt reported concerns about racing thoughts, pt advised that the doctor will be made aware.    Jacquelyne BalintForrest, Marykatherine Sherwood Shanta 03/05/2014, 3:00 PM

## 2014-03-05 NOTE — [facility] Group Notes (Signed)
[facility] Group Notes: (Clinical Social Work)   03/05/2014      Type of Therapy:  Group Therapy   Participation Level:  Did Not Attend - was present the first five minutes of group, sitting in the back of the room on the floor.  Left and did not return.   Ambrose MantleMareida Grossman-Orr, LCSW 03/05/2014, 3:45 PM

## 2014-03-05 NOTE — Progress Notes (Signed)
Patient ID: Donald ShinesRichard C Sullivan, male   DOB: 01/02/1981, 33 y.o.   MRN: [id number] [facility]  03/05/2014 3:51 PM Donald ShinesRichard C Boddy  MRN:  [id number] Subjective:  Donald BurdockRichard states that he is more depressed today and having different thoughts racing.Marland Kitchen. Upon discharge he plans to return home. Counseling sessions and meetings have been arranged with Dr. Mervyn SkeetersA and therapist per the patient. Currently rates his depression 6/10 and anxiety 7/10.  He continues to endorse high levels of anxiety at this time.  Much improved since there was another patient that was disruptive on the unit.  He is actively participating all groups on the unit, and he has made a vow to himself to stay awake all day. He denies SI/HI/AVH.  Diagnosis:   DSM5: Depressive Disorders:  Major Depressive Disorder - Moderate (296.22) Total Time spent with patient: 30 minutes  Axis I: Bipolar, Depressed  ADL's:  Intact  Sleep: Fair  Appetite:  Fair  Psychiatric Specialty Exam: Physical Exam  Vitals reviewed. Psychiatric: His speech is normal and behavior is normal. Judgment and thought content normal. His mood appears anxious. Cognition and memory are normal. He exhibits a depressed mood.    Review of Systems  Constitutional: Positive for malaise/fatigue.  Eyes: Negative.   Respiratory: Negative.   Cardiovascular: Negative.   Gastrointestinal: Negative.   Genitourinary: Negative.   Musculoskeletal: Positive for back pain and joint pain.  Skin: Negative.   Neurological: Negative.   Endo/Heme/Allergies: Negative.   Psychiatric/Behavioral: Positive for depression. The patient is nervous/anxious.     Blood pressure 141/87, pulse 65, temperature 97.8 F (36.6 C), temperature source Oral, resp. rate 16, height 5\' 8"  (1.727 m), weight 67.586 kg (149 lb).Body mass index is 22.66 kg/(m^2).  General Appearance: Fairly Groomed  Patent attorneyye Contact::  Fair  Speech:  Clear and Coherent, Slow and not spontaneous  Volume:  Decreased  Mood:   Depressed  Affect:  Restricted  Thought Process:  Coherent and Goal Directed  Orientation:  Full (Time, Place, and Person)  Thought Content:  symptoms worries concerns  Suicidal Thoughts:  intermittent  Homicidal Thoughts:  No  Memory:  Immediate;   Fair Recent;   Fair Remote;   Fair  Judgement:  Fair  Insight:  Present  Psychomotor Activity:  Decreased  Concentration:  Fair  Recall:  [employer]Fair  Fund of Knowledge:NA  Language: Fair  Akathisia:  No  Handed:    AIMS (if indicated):     Assets:  Desire for Improvement Housing Social Support  Sleep:  Number of Hours: 3.75   Musculoskeletal: Strength & Muscle Tone: within normal limits Gait & Station: normal Patient leans: N/A  Current Medications: Current Facility-Administered Medications  Medication Dose Route Frequency Provider Last Rate Last Dose  . acetaminophen (TYLENOL) tablet 650 mg  650 mg Oral Q6H PRN Rachael FeeIrving A Lugo, MD   650 mg at 03/04/14 1703  . alum & mag hydroxide-simeth (MAALOX/MYLANTA) 200-200-20 MG/5ML suspension 30 mL  30 mL Oral Q4H PRN Lindwood QuaSheila May Agustin, NP      . benzocaine (ORAJEL) 10 % mucosal gel   Mouth/Throat QID PRN Rachael FeeIrving A Lugo, MD      . benztropine (COGENTIN) tablet 1 mg  1 mg Oral BID PRN Rachael FeeIrving A Lugo, MD   1 mg at 03/01/14 0804  . divalproex (DEPAKOTE) DR tablet 750 mg  750 mg Oral Q12H Lindwood QuaSheila May Agustin, NP   750 mg at 03/05/14 [id number]  . FLUoxetine (PROZAC) capsule 60 mg  60 mg  Oral Daily Truman Haywardakia S Starkes, FNP   60 mg at 03/05/14 [id number]  . magnesium hydroxide (MILK OF MAGNESIA) suspension 30 mL  30 mL Oral Daily PRN Velna HatchetSheila May Agustin, NP      . menthol-cetylpyridinium (CEPACOL) lozenge 3 mg  1 lozenge Oral PRN Fransisca KaufmannLaura Davis, NP   3 mg at 03/04/14 2002  . nicotine polacrilex (NICORETTE) gum 2 mg  2 mg Oral PRN Nehemiah MassedFernando Cobos, MD   2 mg at 03/05/14 1444  . oxyCODONE (Oxy IR/ROXICODONE) immediate release tablet 5 mg  5 mg Oral Q4H PRN Rachael FeeIrving A Lugo, MD   5 mg at 03/05/14 1443  . traZODone (DESYREL) tablet  100 mg  100 mg Oral QHS PRN Rachael FeeIrving A Lugo, MD   100 mg at 03/04/14 2115  . ziprasidone (GEODON) capsule 60 mg  60 mg Oral BID WC Rachael FeeIrving A Lugo, MD   60 mg at 03/05/14 [id number]  . zolpidem (AMBIEN) tablet 10 mg  10 mg Oral QHS PRN Rachael FeeIrving A Lugo, MD   10 mg at 03/04/14 2115    Lab Results: No results found for this or any previous visit (from the past 48 hour(s)).  Physical Findings: AIMS: Facial and Oral Movements Muscles of Facial Expression: None, normal Lips and Perioral Area: None, normal Jaw: None, normal Tongue: None, normal,Extremity Movements Upper (arms, wrists, hands, fingers): None, normal Lower (legs, knees, ankles, toes): None, normal, Trunk Movements Neck, shoulders, hips: None, normal, Overall Severity Severity of abnormal movements (highest score from questions above): None, normal Incapacitation due to abnormal movements: None, normal Patient's awareness of abnormal movements (rate only patient's report): No Awareness, Dental Status Current problems with teeth and/or dentures?: No Does patient usually wear dentures?: No  CIWA:    COWS:     Treatment Plan Summary: Daily contact with patient to assess and evaluate symptoms and progress in treatment Medication management  Plan: Supportive approach/coping skills           CBT; mindfulness           Increased Geodon to 80 mg BID           Decrease Prozac 40 mg.  Patient more depressed and c/o racing thoughts +++ Assessment and plan of care update/medication management done in collaboration with nursing staff and Lutricia FeilElena Pharm D.  Medical Decision Making Problem Points:  Review of psycho-social stressors (1) Data Points:  Review of medication regiment & side effects (2)  I certify that inpatient services furnished can reasonably be expected to improve the patient's condition.    Adonis BrookGUSTIN, SHEILA MAY, AGNP-BC 03/05/2014 3:51 PM  Reviewed the information documented and agree with the treatment  plan.  Micajah Dennin,JANARDHAHA R. 03/06/2014 8:15 AM

## 2014-03-05 NOTE — Progress Notes (Signed)
Psychoeducational Group Note  Date:  03/05/2014 Time:  1015  Group Topic/Focus:  Making Healthy Choices:   The focus of this group is to help patients identify negative/unhealthy choices they were using prior to admission and identify positive/healthier coping strategies to replace them upon discharge.  Participation Level:  Active  Participation Quality:  Appropriate  Affect:  Flat  Cognitive:  Oriented  Insight:  Improving  Engagement in Group:  Engaged  Additional Comments:  Pt attended and participated in the group.    Xiamara Hulet A 03/05/2014  

## 2014-03-05 NOTE — Progress Notes (Signed)
Patient did attend the evening speaker AA meeting. Pt left group and returned to his room for 30 min of the meeting. Pt was in attendance at the first fifteen min and the last fifteen.

## 2014-03-05 NOTE — Progress Notes (Signed)
D) Pt has been attending the groups and interacts with his peers appropriately. Continues to ask and receive his prn pain medications every 4 hours and rates his pain always at a 5. Also states that the pain medication relieves his pain. Has been appropriate in the milieu and has been reaching out to others to help and assist others. Rates his depression at an 8, his hopelessness at an 8 and his anxiety at a 0. Affect and mood are improving. A) Given support, reassurance and praise. Encouragement given.  R) Denies SI and HI.

## 2014-03-06 MED ORDER — CLONAZEPAM 0.5 MG PO TABS
0.5000 mg | ORAL_TABLET | Freq: Three times a day (TID) | ORAL | Status: DC | PRN
  Administered 2014-03-06 – 2014-03-07 (×4): 0.5 mg via ORAL
  Filled 2014-03-06 (×4): qty 1

## 2014-03-06 NOTE — [facility] Group Notes (Signed)
[facility]  03/06/2014 3:30 PM  Type of Therapy:  Group Therapy  Participation Level:  Did Not Attend  Wynn BankerHodnett, Hafsa Lohn Hairston 03/06/2014, 3:30 PM

## 2014-03-06 NOTE — Progress Notes (Signed)
Adult Psychoeducational Group Note  Date:  03/06/2014 Time:  10:00AM  Group Topic/Focus:  Dimensions of Wellness:   The focus of this group is to introduce the topic of wellness and discuss the role each dimension of wellness plays in total health.  Participation Level:  Active  Participation Quality:  Appropriate  Affect:  Appropriate  Cognitive:  Appropriate  Insight: Appropriate  Engagement in Group:  Engaged  Modes of Intervention:  Activity  Additional Comments:  Pt was active throughout group and participated in the group activity. Pt played "Wellness Jeopardy" with peers   Gregory Barrick K 03/06/2014, 10:22 AM

## 2014-03-06 NOTE — Progress Notes (Signed)
[facility] Group Notes:  (Nursing/MHT/Case Management/Adjunct)  Date:  03/06/2014  Time:  2:54 PM  Type of Therapy:  Therapeutic Activity  Participation Level:  Active  Participation Quality:  Appropriate  Affect:  Appropriate  Cognitive:  Appropriate  Insight:  Appropriate  Engagement in Group:  Engaged  Modes of Intervention:  Activity  Summary of Progress/Problems: Pts played "Leisure ABC's." Pts identified positive, healthy activities that correspond with each letter of the alphabet.  Darnise Montag C 03/06/2014, 2:54 PM 

## 2014-03-06 NOTE — Plan of Care (Signed)
Problem: Ineffective individual coping Goal: STG: Patient will remain free from self harm Outcome: Progressing Patient has remained free from self harm.     

## 2014-03-06 NOTE — Progress Notes (Signed)
D: Pt presents anxious this morning, fidgety, pacing back and forth. Pt rates anxiety 10/10. Depression 7/10. Pt continues to c/o chronic knee pain. Pt given prn meds for chronic pain and anxiety. Pt compliant with taking meds and attending groups.  A: Medications administered as ordered per MD. Verbal support given. Pt encouraged to attend groups. 15 minute checks performed for safety.  R: Pt safety maintained at this time. Pt receptive to treatment.

## 2014-03-06 NOTE — Progress Notes (Signed)
[facility]  03/06/2014 1:52 PM [name]  MRN:  [id number] Subjective:  [name] states he is still not feeling "right" states that he is still experiencing some of the agitation, the anxiety. He is getting used to the higher dose of Geodon. The increased in Prozac had caused increased anxiety. He did sleep better last night. He feels that the Seroquel was working with the anxiety but his wife felt it was making him too unmotivated, listless. He does not feel the heaviness with the Geodon. He states he was prescribed Klonopin a small dose and that it help him with the anxiety. States he quit it on his own as felt he had the anxiety "beat," but now feels that the Seroquel was helping with the anxiety and off the Seroquel the anxiety has come back. He is concerned about going home feeling like this as his wife has some problems herself and she is taking care of their two kids, and she is worried he might try to OD again Diagnosis:   DSM5: Depressive Disorders:  Major Depressive Disorder - Moderate (296.22) Total Time spent with patient: 30 minutes  Axis I: Bipolar, Depressed and Generalized Anxiety Disorder  ADL's:  Intact  Sleep: Fair  Appetite:  Fair  Psychiatric Specialty Exam: Physical Exam  Review of Systems  Constitutional: Negative.   Eyes: Negative.   Respiratory: Negative.   Cardiovascular: Negative.   Gastrointestinal: Negative.   Genitourinary: Negative.   Musculoskeletal: Positive for back pain and joint pain.  Skin: Negative.   Neurological: Positive for headaches.  Endo/Heme/Allergies: Negative.   Psychiatric/Behavioral: Positive for depression and substance abuse. The patient is nervous/anxious.     Blood pressure 119/86, pulse 88, temperature 97.8 F (36.6 C), temperature source Oral, resp. rate 18, height 5\' 8"  (1.727 m), weight 67.586 kg (149 lb).Body mass index is 22.66 kg/(m^2).  General Appearance: Fairly Groomed  Patent attorneyye Contact::  Fair  Speech:   Clear and Coherent  Volume:  Decreased  Mood:  Anxious and Depressed  Affect:  Restricted  Thought Process:  Coherent and Goal Directed  Orientation:  Full (Time, Place, and Person)  Thought Content:  events sympotms worries concerns  Suicidal Thoughts:  Not "today"  Homicidal Thoughts:  No  Memory:  Immediate;   Fair Recent;   Fair Remote;   Fair  Judgement:  Fair  Insight:  Present  Psychomotor Activity:  Restlessness  Concentration:  Fair  Recall:  FiservFair  Fund of Knowledge:NA  Language: Fair  Akathisia:  No  Handed:    AIMS (if indicated):     Assets:  Desire for Improvement Housing Social Support  Sleep:  Number of Hours: 6.75   Musculoskeletal: Strength & Muscle Tone: within normal limits Gait & Station: normal Patient leans: N/A  Current Medications: Current Facility-Administered Medications  Medication Dose Route Frequency Provider Last Rate Last Dose  . acetaminophen (TYLENOL) tablet 650 mg  650 mg Oral Q6H PRN Rachael FeeIrving A Layton Tappan, MD   650 mg at 03/04/14 1703  . alum & mag hydroxide-simeth (MAALOX/MYLANTA) 200-200-20 MG/5ML suspension 30 mL  30 mL Oral Q4H PRN Lindwood QuaSheila May Agustin, NP      . benzocaine (ORAJEL) 10 % mucosal gel   Mouth/Throat QID PRN Rachael FeeIrving A Lizabeth Fellner, MD      . benztropine (COGENTIN) tablet 1 mg  1 mg Oral BID PRN Rachael FeeIrving A Madelin Weseman, MD   1 mg at 03/01/14 0804  . clonazePAM (KLONOPIN) tablet 0.5 mg  0.5 mg Oral TID  PRN Rachael FeeIrving A Mihran Lebarron, MD   0.5 mg at 03/06/14 1212  . divalproex (DEPAKOTE) DR tablet 750 mg  750 mg Oral Q12H Lindwood QuaSheila May Agustin, NP   750 mg at 03/06/14 0754  . FLUoxetine (PROZAC) capsule 40 mg  40 mg Oral Daily Lindwood QuaSheila May Agustin, NP   40 mg at 03/06/14 0754  . magnesium hydroxide (MILK OF MAGNESIA) suspension 30 mL  30 mL Oral Daily PRN Velna HatchetSheila May Agustin, NP      . menthol-cetylpyridinium (CEPACOL) lozenge 3 mg  1 lozenge Oral PRN Fransisca KaufmannLaura Davis, NP   3 mg at 03/04/14 2002  . nicotine polacrilex (NICORETTE) gum 2 mg  2 mg Oral PRN Nehemiah MassedFernando Cobos, MD   2  mg at 03/06/14 1049  . oxyCODONE (Oxy IR/ROXICODONE) immediate release tablet 5 mg  5 mg Oral Q4H PRN Rachael FeeIrving A Kaniyah Lisby, MD   5 mg at 03/06/14 1048  . traZODone (DESYREL) tablet 100 mg  100 mg Oral QHS PRN Rachael FeeIrving A Maylyn Narvaiz, MD   100 mg at 03/04/14 2115  . ziprasidone (GEODON) capsule 80 mg  80 mg Oral BID WC Lindwood QuaSheila May Agustin, NP   80 mg at 03/06/14 0754  . zolpidem (AMBIEN) tablet 10 mg  10 mg Oral QHS PRN Rachael FeeIrving A Vladislav Axelson, MD   10 mg at 03/04/14 2115    Lab Results: No results found for this or any previous visit (from the past 48 hour(s)).  Physical Findings: AIMS: Facial and Oral Movements Muscles of Facial Expression: None, normal Lips and Perioral Area: None, normal Jaw: None, normal Tongue: None, normal,Extremity Movements Upper (arms, wrists, hands, fingers): None, normal Lower (legs, knees, ankles, toes): None, normal, Trunk Movements Neck, shoulders, hips: None, normal, Overall Severity Severity of abnormal movements (highest score from questions above): None, normal Incapacitation due to abnormal movements: None, normal Patient's awareness of abnormal movements (rate only patient's report): No Awareness, Dental Status Current problems with teeth and/or dentures?: No Does patient usually wear dentures?: No  CIWA:    COWS:     Treatment Plan Summary: Daily contact with patient to assess and evaluate symptoms and progress in treatment Medication management  Plan: Supportive approach/coping skills           Will continue the Prozac at a 40 mg dose           Will resume the Klonopin at the 0.5 mg on a PRN basis           CBT.mindfulness to help deal with the anxiety             Medical Decision Making Problem Points:  Review of psycho-social stressors (1) Data Points:  Review of medication regiment & side effects (2) Review of new medications or change in dosage (2)  I certify that inpatient services furnished can reasonably be expected to improve the patient's condition.    Vianney Kopecky A 03/06/2014, 1:52 PM

## 2014-03-06 NOTE — [facility] Group Notes (Signed)
[facility]   03/06/2014 10:17 AM    Participation Quality:  Appropraite  Mood/Affect:  Appropriate  Depression Rating:  5  Anxiety Rating:  8  Thoughts of Suicide:  No  Will you contract for safety?   NA  Current AVH:  No  Plan for Discharge/Comments:  Patient attended discharge planning group and actively participated in group.  He advised having increased anxiety due to not taking his medication.  Patient shared he stopped medication because he wanted to know if anxiety continues to be a problem. He also advised MD making adjustments to other medications.  He will follow up with [facility].   Transportation Means: Patient has transportation.   Supports:  Patient has a support system.   Wynn BankerHodnett, [name]  03/06/2014  10:17 AM

## 2014-03-06 NOTE — Progress Notes (Signed)
Writer spoke with patient at beginning of shift 1:1. He reported that his day has been good and his medications were being adjusted. He spoke about his seizure he had before his admission and how if his wife had not found him he would have been possibly been dead. Patient has had his prn of pain medication shortly before writers shift began. He reports relief from pain medication. He denies si/hi/a/v hallucinations. He plans to attend AA group this evening. Support and encouragement given, safety maintained on unit with 15 min checks.

## 2014-03-07 LAB — VALPROIC ACID LEVEL: Valproic Acid Lvl: 65.2 ug/mL (ref 50.0–100.0)

## 2014-03-07 MED ORDER — ZIPRASIDONE HCL 80 MG PO CAPS: 80.0000 mg | ORAL_CAPSULE | Freq: Two times a day (BID) | ORAL | Status: DC

## 2014-03-07 MED ORDER — DIVALPROEX SODIUM 250 MG PO DR TAB: 750.0000 mg | DELAYED_RELEASE_TABLET | Freq: Two times a day (BID) | ORAL | Status: DC

## 2014-03-07 MED ORDER — FLUOXETINE HCL 40 MG PO CAPS: 40.0000 mg | ORAL_CAPSULE | Freq: Every day | ORAL | Status: DC

## 2014-03-07 MED ORDER — CLONAZEPAM 0.5 MG PO TABS: 0.5000 mg | ORAL_TABLET | Freq: Three times a day (TID) | ORAL | Status: DC | PRN

## 2014-03-07 MED ORDER — OXYCODONE HCL 5 MG PO TABS: 5.0000 mg | ORAL_TABLET | ORAL | Status: DC | PRN

## 2014-03-07 MED ORDER — TRAZODONE HCL 100 MG PO TABS: 100.0000 mg | ORAL_TABLET | Freq: Every evening | ORAL | Status: DC | PRN

## 2014-03-07 MED ORDER — ZOLPIDEM TARTRATE 10 MG PO TABS: 10.0000 mg | ORAL_TABLET | Freq: Every evening | ORAL | Status: DC | PRN

## 2014-03-07 MED ORDER — NICOTINE 14 MG/24HR TD PT24
14.0000 mg | MEDICATED_PATCH | Freq: Every day | TRANSDERMAL | Status: DC
  Filled 2014-03-07: qty 10

## 2014-03-07 MED ORDER — DIVALPROEX SODIUM 250 MG PO DR TAB
750.0000 mg | DELAYED_RELEASE_TABLET | Freq: Two times a day (BID) | ORAL | Status: DC
  Filled 2014-03-07 (×2): qty 84

## 2014-03-07 NOTE — Progress Notes (Signed)
[facility] :  Will you be returning to the same living situation after discharge: Yes,  Patient is returning to his home. At discharge, do you have transportation home?:Yes,  Patient to arrange transportation. Do you have the ability to pay for your medications:No.Patient needs assistance with indigent medications due to not having money to meet co-pay.  Release of information consent forms completed and in the chart;  Patient's signature needed at discharge.  Patient to Follow up at: Follow-up Information   Follow up with Dr. Jannifer FranklinAkintayo - [facility] On 03/16/2014. (You are scheduled with Dr. Jannifer FranklinAkintayo on Thursday, March 16, 2014 at 12:45 PM)    Contact information:   [address] [place], [place]   [address]  [phone]      Follow up with Wynona CanesChristine - [facility] On 03/15/2014. (You are scheduled with Wynona Caneshristine on Wednesday, March 15, 2014 at 1:00 PM)    Contact information:   [address] [place], [place]   [address]  [phone]      Patient denies SI/HI: Patient no longer endorsing SI/HI or other thoughts of self harm.  Safety Planning and Suicide Prevention discussed: .Reviewed with all patients during discharge planning group   [name], [name] 03/07/2014, 12:53 PM

## 2014-03-07 NOTE — Progress Notes (Signed)
Pt reports he is doing better, but he does not feel he is quite ready for discharge.  Pt feels his meds are still not right to prevent him from possibly overdosing again.  He frequently asks for his pain medication.  He has been appropriate on the unit otherwise.  Pt says he has been attending groups.  He denies SI/HI/AV.  Pt makes his needs known to staff.  Support and encouragement offered.  Discharge plans are still in process.  Safety maintained with q15 minute checks.

## 2014-03-07 NOTE — Progress Notes (Signed)
Discharge note: Pt received both written and verbal discharge instructions. Pt verbalized understanding of discharge instructions. Pt agreed to f/u appt and med regimen. Pt received belongings, sample meds and prescriptions. Pt denies SI/HI/DEP. Pt safely left [facility].

## 2014-03-07 NOTE — Plan of Care (Signed)
Problem: Diagnosis: Increased Risk For Suicide Attempt Goal: LTG-Patient Will Report Improved Mood and Deny Suicidal LTG (by discharge) Patient will report improved mood and deny suicidal ideation.  Outcome: Progressing Pt reports that his mood is improving and he does not have suicidal thoughts.  He is hopeful to find the right combination of medications so that he can return home at discharge.

## 2014-03-07 NOTE — Tx Team (Addendum)
Interdisciplinary Treatment Plan Update   Date Reviewed:  03/07/2014  Time Reviewed:  8:29 AM  Progress in Treatment:   Attending groups: Yes Participating in groups: Yes Taking medication as prescribed: Yes  Tolerating medication: Yes Family/Significant other contact made:  No, patient declined collateral contact Patient understands diagnosis: Yes, Yes, patient understands diagnosis and need for treatment. Discussing patient identified problems/goals with staff: Yes, Yes, patient is able to express goals for treatment and discharge. Medical problems stabilized or resolved: Yes Denies suicidal/homicidal ideation: Yes Patient has not harmed self or others: Yes  For review of initial/current patient goals, please see plan of care.  Estimated Length of Stay:  Discharge today  Reasons for Continued Hospitalization:    New Problems/Goals identified:    Discharge Plan or Barriers:   Home with outpatient follow [facility]  Additional Comments:  Patient and CSW reviewed patient's identified goals and treatment plan.  Patient verbalized understanding and agreed to treatment plan.   Attendees:  Patient:  03/07/2014 8:29 AM   Signature:  Sallyanne HaversF. Cobos, MD 03/07/2014 8:29 AM  Signature: Geoffery LyonsIrving Lugo, MD 03/07/2014 8:29 AM  Signature: [place] Minersonna Shimp, RN 03/07/2014 8:29 AM  Signature:  03/07/2014 8:29 AM  Signature: 03/07/2014 8:29 AM  Signature:  Juline PatchQuylle Aditri Louischarles, LCSW 03/07/2014 8:29 AM  Signature:  Belenda CruiseKristin Drinkard, LCSW-A 03/07/2014 8:29 AM  Signature:  Leisa LenzValerie Enoch, Care Coordinator [facility]  03/07/2014 8:29 AM  Signature:    Jon Gillselora Sutton, [facility] Transition Team 03/07/2014 8:29 AM  Signature: 03/07/2014  8:29 AM  Signature:   Onnie BoerJennifer Clark, RN [facility] 03/07/2014  8:29 AM  Signature:   03/07/2014  8:29 AM    Scribe for Treatment Team:   Juline PatchQuylle Alexza Norbeck,  03/07/2014 8:29 AM
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
## 2014-03-10 NOTE — Progress Notes (Signed)
Patient Discharge Instructions:  After Visit Summary (AVS): Faxed to: 03/10/14  Discharge Summary Note: Faxed to: 03/10/14  Psychiatric Admission Assessment Note: Faxed to: 03/10/14  Suicide Risk Assessment - Discharge Assessment: Faxed to: 03/10/14  Faxed/Sent to the Next Level Care provider: 03/10/14 Faxed to [facility] @ [phone]   Jerelene ReddenSheena E Jauca, 03/10/2014, 2:35 PM

## 2014-03-15 ENCOUNTER — Encounter (HOSPITAL_COMMUNITY): Payer: Self-pay | Admitting: Emergency Medicine

## 2014-03-15 ENCOUNTER — Emergency Department (HOSPITAL_COMMUNITY)
Admission: EM | Admit: 2014-03-15 | Discharge: 2014-03-16 | Disposition: A | Discharge status: Home / Self Care | Payer: Medicaid Other | Attending: Emergency Medicine | Admitting: Emergency Medicine

## 2014-03-15 DIAGNOSIS — Z79899 Other long term (current) drug therapy: Secondary | ICD-10-CM | POA: Diagnosis not present

## 2014-03-15 DIAGNOSIS — G40909 Epilepsy, unspecified, not intractable, without status epilepticus: Secondary | ICD-10-CM | POA: Diagnosis not present

## 2014-03-15 DIAGNOSIS — R45851 Suicidal ideations: Secondary | ICD-10-CM | POA: Insufficient documentation

## 2014-03-15 DIAGNOSIS — Z8781 Personal history of (healed) traumatic fracture: Secondary | ICD-10-CM | POA: Insufficient documentation

## 2014-03-15 DIAGNOSIS — Z720 Tobacco use: Secondary | ICD-10-CM | POA: Diagnosis not present

## 2014-03-15 DIAGNOSIS — Z87828 Personal history of other (healed) physical injury and trauma: Secondary | ICD-10-CM | POA: Insufficient documentation

## 2014-03-15 LAB — RAPID URINE DRUG SCREEN, HOSP PERFORMED
Amphetamines: NOT DETECTED
BARBITURATES: NOT DETECTED
Benzodiazepines: NOT DETECTED
Cocaine: NOT DETECTED
Opiates: NOT DETECTED
Tetrahydrocannabinol: NOT DETECTED

## 2014-03-15 LAB — CBC WITH DIFFERENTIAL/PLATELET
Basophils Absolute: 0 10*3/uL (ref 0.0–0.1)
Basophils Relative: 0 % (ref 0–1)
Eosinophils Absolute: 0 10*3/uL (ref 0.0–0.7)
Eosinophils Relative: 0 % (ref 0–5)
HEMATOCRIT: 38.8 % — AB (ref 39.0–52.0)
Hemoglobin: 13.6 g/dL (ref 13.0–17.0)
LYMPHS ABS: 2.9 10*3/uL (ref 0.7–4.0)
Lymphocytes Relative: 17 % (ref 12–46)
MCH: 33 pg (ref 26.0–34.0)
MCHC: 35.1 g/dL (ref 30.0–36.0)
MCV: 94.2 fL (ref 78.0–100.0)
MONOS PCT: 7 % (ref 3–12)
Monocytes Absolute: 1.2 10*3/uL — ABNORMAL HIGH (ref 0.1–1.0)
NEUTROS ABS: 12.9 10*3/uL — AB (ref 1.7–7.7)
Neutrophils Relative %: 76 % (ref 43–77)
Platelets: 298 10*3/uL (ref 150–400)
RBC: 4.12 MIL/uL — ABNORMAL LOW (ref 4.22–5.81)
RDW: 12.5 % (ref 11.5–15.5)
WBC: 17 10*3/uL — AB (ref 4.0–10.5)

## 2014-03-15 LAB — COMPREHENSIVE METABOLIC PANEL
ALBUMIN: 4 g/dL (ref 3.5–5.2)
ALT: 10 U/L (ref 0–53)
ANION GAP: 15 (ref 5–15)
AST: 13 U/L (ref 0–37)
Alkaline Phosphatase: 76 U/L (ref 39–117)
BUN: 9 mg/dL (ref 6–23)
CO2: 24 mEq/L (ref 19–32)
CREATININE: 0.74 mg/dL (ref 0.50–1.35)
Calcium: 9.3 mg/dL (ref 8.4–10.5)
Chloride: 98 mEq/L (ref 96–112)
GFR calc Af Amer: >90 mL/min (ref >90)
GFR calc non Af Amer: >90 mL/min (ref >90)
Glucose, Bld: 69 mg/dL — ABNORMAL LOW (ref 70–99)
Potassium: 3.9 mEq/L (ref 3.7–5.3)
Sodium: 137 mEq/L (ref 137–147)
Total Protein: 7.1 g/dL (ref 6.0–8.3)

## 2014-03-15 LAB — ETHANOL: Alcohol, Ethyl (B): <11 mg/dL (ref 0–11)

## 2014-03-15 LAB — CBG MONITORING, ED: Glucose-Capillary: 91 mg/dL (ref 70–99)

## 2014-03-15 LAB — ACETAMINOPHEN LEVEL

## 2014-03-15 LAB — SALICYLATE LEVEL: Salicylate Lvl: <2 mg/dL — ABNORMAL LOW (ref 2.8–20.0)

## 2014-03-15 LAB — VALPROIC ACID LEVEL: Valproic Acid Lvl: 42.4 ug/mL — ABNORMAL LOW (ref 50.0–100.0)

## 2014-03-15 MED ORDER — SODIUM CHLORIDE 0.9 % IV BOLUS (SEPSIS)
1000.0000 mL | Freq: Once | INTRAVENOUS | Status: AC
Start: 2014-03-15 — End: 2014-03-15
  Administered 2014-03-15: 1000 mL via INTRAVENOUS

## 2014-03-15 NOTE — ED Notes (Signed)
Pt remains monitored by blood pressure, pulse ox, and 12 lead.  
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
## 2014-03-15 NOTE — ED Notes (Signed)
Pts safety sitter remains at bedside. 

## 2014-03-15 NOTE — ED Notes (Signed)
Per EMS, pt snorted an unknown amount of ambion and oxycodone tonight about 1 hour ago. BP: 120/86, HR:93, SpO2: 97% on RA. CBG:74 Pt reports that he did this to hurt himself.

## 2014-03-16 ENCOUNTER — Inpatient Hospital Stay (HOSPITAL_COMMUNITY)
Admission: AD | Admit: 2014-03-16 | Discharge: 2014-03-21 | DRG: 885 | Disposition: A | Discharge status: Home / Self Care | Payer: Medicaid Other | Source: Intra-hospital | Attending: Psychiatry | Admitting: Psychiatry

## 2014-03-16 ENCOUNTER — Encounter (HOSPITAL_COMMUNITY): Payer: Self-pay | Admitting: *Deleted

## 2014-03-16 DIAGNOSIS — Z833 Family history of diabetes mellitus: Secondary | ICD-10-CM | POA: Diagnosis not present

## 2014-03-16 DIAGNOSIS — F419 Anxiety disorder, unspecified: Secondary | ICD-10-CM | POA: Insufficient documentation

## 2014-03-16 DIAGNOSIS — F17220 Nicotine dependence, chewing tobacco, uncomplicated: Secondary | ICD-10-CM | POA: Diagnosis present

## 2014-03-16 DIAGNOSIS — R45851 Suicidal ideations: Secondary | ICD-10-CM | POA: Diagnosis present

## 2014-03-16 DIAGNOSIS — G4700 Insomnia, unspecified: Secondary | ICD-10-CM | POA: Diagnosis present

## 2014-03-16 DIAGNOSIS — F411 Generalized anxiety disorder: Secondary | ICD-10-CM | POA: Diagnosis present

## 2014-03-16 DIAGNOSIS — Z8249 Family history of ischemic heart disease and other diseases of the circulatory system: Secondary | ICD-10-CM

## 2014-03-16 DIAGNOSIS — F410 Panic disorder [episodic paroxysmal anxiety] without agoraphobia: Secondary | ICD-10-CM | POA: Diagnosis present

## 2014-03-16 DIAGNOSIS — F3132 Bipolar disorder, current episode depressed, moderate: Secondary | ICD-10-CM | POA: Diagnosis present

## 2014-03-16 DIAGNOSIS — F17210 Nicotine dependence, cigarettes, uncomplicated: Secondary | ICD-10-CM | POA: Diagnosis present

## 2014-03-16 DIAGNOSIS — G40909 Epilepsy, unspecified, not intractable, without status epilepticus: Secondary | ICD-10-CM | POA: Diagnosis present

## 2014-03-16 DIAGNOSIS — T50902A Poisoning by unspecified drugs, medicaments and biological substances, intentional self-harm, initial encounter: Secondary | ICD-10-CM | POA: Diagnosis present

## 2014-03-16 MED ORDER — FLUOXETINE HCL 20 MG PO CAPS
40.0000 mg | ORAL_CAPSULE | Freq: Every day | ORAL | Status: DC
  Filled 2014-03-16: qty 2

## 2014-03-16 MED ORDER — TRAZODONE HCL 100 MG PO TABS
100.0000 mg | ORAL_TABLET | Freq: Every day | ORAL | Status: DC
  Administered 2014-03-16 – 2014-03-19 (×4): 100 mg via ORAL
  Filled 2014-03-16 (×5): qty 1

## 2014-03-16 MED ORDER — IBUPROFEN 200 MG PO TABS
400.0000 mg | ORAL_TABLET | Freq: Four times a day (QID) | ORAL | Status: DC | PRN
  Administered 2014-03-17: 400 mg via ORAL
  Filled 2014-03-16: qty 2

## 2014-03-16 MED ORDER — OXYCODONE HCL 5 MG PO TABS
5.0000 mg | ORAL_TABLET | ORAL | Status: DC | PRN
Start: 2014-03-16 — End: 2014-03-17
  Administered 2014-03-16 – 2014-03-17 (×5): 5 mg via ORAL
  Filled 2014-03-16 (×5): qty 1

## 2014-03-16 MED ORDER — BENZTROPINE MESYLATE 0.5 MG PO TABS
0.5000 mg | ORAL_TABLET | Freq: Every day | ORAL | Status: DC
  Administered 2014-03-16 – 2014-03-20 (×5): 0.5 mg via ORAL
  Filled 2014-03-16 (×2): qty 1
  Filled 2014-03-16: qty 3
  Filled 2014-03-16 (×5): qty 1

## 2014-03-16 MED ORDER — ALUM & MAG HYDROXIDE-SIMETH 200-200-20 MG/5ML PO SUSP: 30.0000 mL | ORAL | Status: DC | PRN

## 2014-03-16 MED ORDER — GABAPENTIN 100 MG PO CAPS
200.0000 mg | ORAL_CAPSULE | Freq: Three times a day (TID) | ORAL | Status: DC
  Administered 2014-03-16 – 2014-03-17 (×3): 200 mg via ORAL
  Filled 2014-03-16 (×9): qty 2

## 2014-03-16 MED ORDER — NICOTINE POLACRILEX 2 MG MT GUM
2.0000 mg | CHEWING_GUM | OROMUCOSAL | Status: DC | PRN
  Administered 2014-03-16 – 2014-03-21 (×17): 2 mg via ORAL
  Filled 2014-03-16 (×2): qty 1

## 2014-03-16 MED ORDER — ZIPRASIDONE HCL 20 MG PO CAPS
80.0000 mg | ORAL_CAPSULE | Freq: Two times a day (BID) | ORAL | Status: DC
  Administered 2014-03-16 (×2): 80 mg via ORAL
  Filled 2014-03-16 (×2): qty 4

## 2014-03-16 MED ORDER — PNEUMOCOCCAL VAC POLYVALENT 25 MCG/0.5ML IJ INJ
0.5000 mL | INJECTION | INTRAMUSCULAR | Status: AC
  Administered 2014-03-19: 0.5 mL via INTRAMUSCULAR

## 2014-03-16 MED ORDER — TRAZODONE HCL 50 MG PO TABS
100.0000 mg | ORAL_TABLET | Freq: Every evening | ORAL | Status: DC | PRN
  Administered 2014-03-16: 100 mg via ORAL
  Filled 2014-03-16: qty 2

## 2014-03-16 MED ORDER — MAGNESIUM HYDROXIDE 400 MG/5ML PO SUSP: 30.0000 mL | Freq: Every day | ORAL | Status: DC | PRN

## 2014-03-16 MED ORDER — DIVALPROEX SODIUM 250 MG PO DR TAB
750.0000 mg | DELAYED_RELEASE_TABLET | Freq: Two times a day (BID) | ORAL | Status: DC
  Administered 2014-03-16: 750 mg via ORAL
  Filled 2014-03-16: qty 3

## 2014-03-16 MED ORDER — MUSCLE RUB 10-15 % EX CREA: TOPICAL_CREAM | CUTANEOUS | Status: DC | PRN

## 2014-03-16 MED ORDER — DIVALPROEX SODIUM ER 500 MG PO TB24
1000.0000 mg | ORAL_TABLET | Freq: Every day | ORAL | Status: DC
  Administered 2014-03-16 – 2014-03-20 (×5): 1000 mg via ORAL
  Filled 2014-03-16: qty 6
  Filled 2014-03-16 (×8): qty 2

## 2014-03-16 MED ORDER — OLANZAPINE 5 MG PO TBDP
5.0000 mg | ORAL_TABLET | Freq: Every day | ORAL | Status: DC
  Administered 2014-03-16: 5 mg via ORAL
  Filled 2014-03-16 (×4): qty 1

## 2014-03-16 NOTE — Progress Notes (Signed)
Pt attended group. Pt's insight and mood was appropriate. Pt shared that he had a good day because the old medication is leaving his system.

## 2014-03-16 NOTE — [facility] Counselor (Signed)
[facility]. Writer notified pt's RN Marylene Landngela.   Evette Cristalaroline Paige Mumin Denomme, [place]  Assessment Counselor

## 2014-03-16 NOTE — Tx Team (Addendum)
  Interdisciplinary Treatment Plan Update   Date Reviewed:  03/16/2014  Time Reviewed:  9:52 AM  Progress in Treatment:   Attending groups: Yes Participating in groups: Yes Taking medication as prescribed: Yes  Tolerating medication: Yes Family/Significant other contact made: [name] refusing consent. SPE completed with [name].   Patient understands diagnosis: Yes AEB asking for help with depression and SI/medication stabilization.  Discussing patient identified problems/goals with staff: Yes  See initial care plan Medical problems stabilized or resolved: Yes Denies suicidal/homicidal ideation: Yes, denies Patient has not harmed self or others: Yes  For review of initial/current patient goals, please see plan of care.  Estimated Length of Stay:  Discharge anticipated for today 11/3  Reason for Continuation of Hospitalization: Depression Medication stabilization  New Problems/Goals identified:  N/A  Discharge Plan or Barriers:   [name] plans to return home and follow-up at Dr. Gloris ManchesterAkintayo's office. Appt for both med management and therapy scheduled for next Friday.   Additional Comments:  Patient presents via EMS due to snorting an unknown amount of Ambien and oxycodone. He reports that he did this as a suicide attempt. He reports feeling "very depressed". He states that he had 2 deaths in his family in the last year and since then has had worsening depression.  UDS was negative for all drugs, as was BAC.    Attendees:  Patient:    Family:    Physician: Dr. Jama Flavorsobos; Dr. Dub MikesLugo  03/21/2014 9:30 AM   Nursing: Quintella ReichertBeverly Knight; [place] Minersonna Shimp; Carney LivingBrittney Tyson, RN  03/21/2014 9:30 AM   Clinical Social Worker: Belenda CruiseKristin Ellakate Gonsalves, LCSWA  03/21/2014 9:30 AM   Other: Juline PatchQuylle Hodnett, LCSW  03/21/2014 9:30 AM   Other: Leisa LenzValerie Enoch, Vesta [facility]er[facility] Liaison  03/21/2014 9:30 AM   Other:    Other: Santa GeneraAnne Cunningham, LCSW  03/21/2014 9:30 AM   Other:    Other:    Other:    Other:    Other:

## 2014-03-16 NOTE — ED Notes (Signed)
TTS in process 

## 2014-03-16 NOTE — BH Assessment (Signed)
Reviewed notes prior to assessment. Pt is medically cleared for assessment per Dr. Patria Maneampos' note. Pt has long-standing hx of MH concerns and substance abuse. Presents with suicide attempt via overdose of inhaled Ambian and oxycodone.   Requested cart be placed with pt for assessment.   Assessment to begin shortly.    Donald BernhardtNancy Neelah Mannings, [facility] Triage Specialist 03/16/2014 5:47 AM

## 2014-03-16 NOTE — Progress Notes (Signed)
Patient ID: [name], male   DOB: [birth year], 33 y.o.   MRN: [id number] D: Pt reports his day was okay. Patient denies SI/HI/AVH. Pt mood and affect appeared depressed and flat. Pt report wanting to get medication that works for him. Pt attended evening wrap up group and engage in discussion. Pt observed interacting well with peers. Cooperative with assessment. No acute distressed noted at this time.   A: Met with pt 1:1. Medications administered as prescribed. Writer encouraged pt to discuss feelings. Pt encouraged to come to staff with any question or concerns.   R: Patient remains safe. He is complaint with medications and denies any adverse reaction. Continue current POC.

## 2014-03-16 NOTE — ED Notes (Signed)
Safety sitter at the bedside. °
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
## 2014-03-16 NOTE — ED Notes (Signed)
Pt becoming increasingly anxious and continuous to ask for pain medication. MD notified.
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
## 2014-03-16 NOTE — BH Assessment (Signed)
Relayed results of assessment to Donell SievertSpencer Simon, PA. Pt meets inpt criteria per Donell SievertSpencer Simon, PA, and [facility].   Informed ED staff of recommendations to seek placement.   Clista BernhardtNancy Caytlyn Evers, [facility] Triage Specialist 03/16/2014 6:23 AM
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
## 2014-03-16 NOTE — ED Provider Notes (Signed)
I saw and evaluated the patient, reviewed the resident's note and I agree with the findings and plan.   EKG Interpretation   Date/Time:  Wednesday March 15 2014 21:13:48 EDT Ventricular Rate:  77 PR Interval:  152 QRS Duration: 98 QT Interval:  377 QTC Calculation: 427 R Axis:   77 Text Interpretation:  Sinus rhythm No significant change was found  Confirmed by Shaely Gadberry  MD, Caryn BeeKEVIN ([id number]) on 03/16/2014 1:14:02 AM      Patient need to be seen and evaluated by the psychiatric team for evaluation.  This was a reported attempted killing himself.  He has a recent hospitalization for significant cervical overdose.  Patient should not leave the emergency department.  He understands that he will need to stay in the ER and be evaluated by the psychiatric team.  Medically clear at this time.  Patient has a long-standing history of substance abuse and does not need any benzodiazepines, opioid pain meds.  Lyanne CoKevin M Coulter Oldaker, MD 03/16/14 0130

## 2014-03-16 NOTE — ED Provider Notes (Signed)
Patient accepted to behavioral health. Accepting physician is Dr. Lenn CalSimmons  Donald Nolting T Angellynn Kimberlin, MD 03/16/14 [phone]

## 2014-03-16 NOTE — [facility] Suicide Risk Assessment (Signed)
[facility] INPATIENT:  Family/Significant Other Suicide Prevention Education  Suicide Prevention Education:  Patient Refusal for Family/Significant Other Suicide Prevention Education: The patient [name] has refused to provide written consent for family/significant other to be provided Family/Significant Other Suicide Prevention Education during admission and/or prior to discharge.  Physician notified.  SPE completed with pt. SPI pamphlet provided to pt and he was encouraged to share information with support network, ask questions, and talk about any concerns relating to SPE.  Smart, [name] LCSWA  03/16/2014, 3:02 PM

## 2014-03-16 NOTE — [facility] Suicide Risk Assessment (Signed)
   Nursing information obtained from:    Demographic factors:    Current Mental Status:    Loss Factors:    Historical Factors:    Risk Reduction Factors:    Total Time spent with patient: 30 minutes  CLINICAL FACTORS:   Bipolar Disorder:   Depressive phase  Psychiatric Specialty Exam: Physical Exam Please see H&P.   ROS  Blood pressure 110/68, pulse 102, temperature 98.4 F (36.9 C), temperature source Oral, resp. rate 16, height 5\' 8"  (1.727 m), weight 68.04 kg (150 lb).Body mass index is 22.81 kg/(m^2).  Please see H&P.    SUICIDE RISK:   Moderate:  Frequent suicidal ideation with limited intensity, and duration, some specificity in terms of plans, no associated intent, good self-control, limited dysphoria/symptomatology, some risk factors present, and identifiable protective factors, including available and accessible social support.  PLAN OF CARE: Please see H&P.   I certify that inpatient services furnished can reasonably be expected to improve the patient's condition.  Liem Copenhaver MD 03/16/2014, 2:44 PM

## 2014-03-16 NOTE — [facility] Group Notes (Signed)
[facility] LCSW Group Therapy  03/16/2014 1:57 PM   Type of Therapy:  Group Therapy  Participation Level:  Active  Participation Quality:  Attentive  Affect:  Appropriate  Cognitive:  Appropriate  Insight:  Improving  Engagement in Therapy:  Engaged  Modes of Intervention:  Clarification, Education, Exploration and Socialization  Summary of Progress/Problems: Today's group focused on relapse prevention.  We defined the term, and then brainstormed on ways to prevent relapse. [name] wandered into group late.  He identified recovery as finding positive people to be with, and staying away from the negative.  "I need to change my people, places and things in order for this to work."    [name], [name] 03/16/2014 , 1:57 PM

## 2014-03-16 NOTE — ED Notes (Signed)
MD at the bedside  

## 2014-03-17 ENCOUNTER — Telehealth: Payer: Self-pay | Admitting: Neurology

## 2014-03-17 LAB — HEMOGLOBIN A1C
Hgb A1c MFr Bld: 5.2 % (ref <5.7)
Mean Plasma Glucose: 103 mg/dL (ref <117)

## 2014-03-17 LAB — LIPID PANEL
CHOLESTEROL: 170 mg/dL (ref 0–200)
HDL: 40 mg/dL (ref >39)
LDL CALC: 87 mg/dL (ref 0–99)
Total CHOL/HDL Ratio: 4.3 RATIO
Triglycerides: 214 mg/dL — ABNORMAL HIGH (ref <150)
VLDL: 43 mg/dL — ABNORMAL HIGH (ref 0–40)

## 2014-03-17 MED ORDER — OXYCODONE HCL 5 MG PO TABS
5.0000 mg | ORAL_TABLET | Freq: Two times a day (BID) | ORAL | Status: DC | PRN
  Administered 2014-03-17 – 2014-03-21 (×8): 5 mg via ORAL
  Filled 2014-03-17 (×8): qty 1

## 2014-03-17 MED ORDER — GABAPENTIN 300 MG PO CAPS
300.0000 mg | ORAL_CAPSULE | Freq: Three times a day (TID) | ORAL | Status: DC
  Administered 2014-03-17 – 2014-03-21 (×12): 300 mg via ORAL
  Filled 2014-03-17 (×2): qty 1
  Filled 2014-03-17 (×2): qty 9
  Filled 2014-03-17 (×12): qty 1
  Filled 2014-03-17: qty 9

## 2014-03-17 MED ORDER — OLANZAPINE 10 MG PO TBDP
10.0000 mg | ORAL_TABLET | Freq: Every day | ORAL | Status: DC
  Administered 2014-03-17 – 2014-03-20 (×4): 10 mg via ORAL
  Filled 2014-03-17: qty 1
  Filled 2014-03-17: qty 3
  Filled 2014-03-17 (×4): qty 1

## 2014-03-17 MED ORDER — LIDOCAINE 5 % EX PTCH
2.0000 | MEDICATED_PATCH | CUTANEOUS | Status: DC
  Filled 2014-03-17 (×3): qty 2

## 2014-03-17 NOTE — Telephone Encounter (Signed)
Noted  

## 2014-03-17 NOTE — Progress Notes (Signed)
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 Pt participate in group.  

## 2014-03-17 NOTE — Progress Notes (Signed)
D) Pt has been irritable, sullen, in mood and affect. Pt focused on "getting out of here". Pt became agitated when nurse discussed medication changes with him. Pt became verbally aggressive with staff. Pt was cursing and threatening at that time. [name] demanded we "change his doctor", and that "she is a fucking bitch", "she cant change my medicine." and wanting to be placed on 300 hall with [name]. Pt also wanting to "sign myself out". A) level 3 obs for safety, support and reassurance provided. Redirect as needed. Kind enforceable limits set. R) Agitated.
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
## 2014-03-17 NOTE — Progress Notes (Signed)
NUTRITION ASSESSMENT  Pt identified as at risk on the Malnutrition Screen Tool  INTERVENTION: 1. Educated patient on the importance of nutrition and encouraged intake of food and beverages. 2. Discussed weight goals.  NUTRITION DIAGNOSIS: Unintentional weight loss related to sub-optimal intake as evidenced by pt report.   Goal: Pt to meet >/= 90% of their estimated nutrition needs.  Monitor:  PO intake  Assessment:  Patient admitted with depression and SI.  Patient reports eating well now but poor po for a few days prior to admit.  Patient is unsure why.  UBW about 140 lbs with gain.  33 y.o. male  Height: Ht Readings from Last 1 Encounters:  03/16/14 5\' 8"  (1.727 m)    Weight: Wt Readings from Last 1 Encounters:  03/16/14 150 lb (68.04 kg)    Weight Hx: Wt Readings from Last 10 Encounters:  03/16/14 150 lb (68.04 kg)  03/15/14 153 lb (69.4 kg)  02/28/14 149 lb (67.586 kg)  02/26/14 152 lb 1.9 oz (69 kg)  12/07/13 140 lb (63.504 kg)    BMI:  Body mass index is 22.81 kg/(m^2). Pt meets criteria for normal weight based on current BMI.  Estimated Nutritional Needs: Kcal: 25-30 kcal/kg Protein: > 1 gram protein/kg Fluid: 1 ml/kcal  Diet Order: General Pt is also offered choice of unit snacks mid-morning and mid-afternoon.  Pt is eating as desired.   Lab results and medications reviewed.   Oran ReinLaura Jobe, RD, LDN Clinical Inpatient Dietitian Pager:  [phone] Weekend and after hours pager:  [phone]

## 2014-03-17 NOTE — Plan of Care (Signed)
Problem: Ineffective individual coping Goal: STG: Patient will remain free from self harm Outcome: Completed/Met Date Met:  03/17/14 Pt is safe and free of self harm. Pt denies SI/HI

## 2014-03-17 NOTE — Progress Notes (Signed)
D: Patient pacing in the hallway on first approach.  Patient states he is upset that his pain medication was changed.  Patient state he was getting it every 4 hours and hit was changed to every 12 hours.  Patient states,"The Bitch doesn't know what she is doing."  Patient states his goal for today is to go home.  Patient states he did not learn anything and states he will not attend groups.  Patient visibly angry and argumentative with staff but redirectable.   A: Staff to monitor Q 15 mins for safety.  Encouragement and support offered.  Scheduled medications administered per orders.  Oxycodone administered prn for pain. R: Patient remains safe on the unit.  Patient did not attend group tonight.  Patient visible on the unit.  Patient taking administered medications.

## 2014-03-17 NOTE — [facility] Group Notes (Signed)
[facility]   03/17/2014 10:40 AM  Participation Quality:  Appropriate   Mood/Affect:  Appropriate  Depression Rating:  0  Anxiety Rating:  0  Thoughts of Suicide:  No Will you contract for safety?   NA  Current AVH:  No  Plan for Discharge/Comments:  Pt focused on d/c asap. Pt stated that his meds appear to be working well and he has follow-[place] place [place] [facility] [place] and therapy.   Transportation Means: wife   Supports: wife and some family supports  Counselling psychologistmart, OncologistHeather LCSWA

## 2014-03-17 NOTE — [facility] Group Notes (Signed)
[facility] LCSW Group Therapy  03/17/2014 3:05 PM  Type of Therapy:  Group Therapy  Participation Level: Invited- Did Not Attend   Smart, Iara Monds LCSWA  03/17/2014, 3:05 PM

## 2014-03-17 NOTE — Telephone Encounter (Signed)
Pt called to cancel his NP appt for 03/21/14 w/ Dr. Karel JarvisAquino. Pt has been admitted to the hospital and will Call later to r/s. Dr. Clarene DukeLittle, referring provider was notified.

## 2014-03-18 DIAGNOSIS — F3130 Bipolar disorder, current episode depressed, mild or moderate severity, unspecified: Secondary | ICD-10-CM

## 2014-03-18 DIAGNOSIS — F419 Anxiety disorder, unspecified: Secondary | ICD-10-CM

## 2014-03-18 MED ORDER — INDOMETHACIN 25 MG PO CAPS
ORAL_CAPSULE | ORAL | Status: AC
  Administered 2014-03-18: 16:00:00
  Filled 2014-03-18: qty 1

## 2014-03-18 MED ORDER — MELOXICAM 7.5 MG PO TABS
ORAL_TABLET | ORAL | Status: AC
  Filled 2014-03-18: qty 2

## 2014-03-18 MED ORDER — INDOMETHACIN 25 MG PO CAPS
25.0000 mg | ORAL_CAPSULE | Freq: Two times a day (BID) | ORAL | Status: DC
  Administered 2014-03-18: 25 mg via ORAL
  Filled 2014-03-18 (×7): qty 1

## 2014-03-18 MED ORDER — DULOXETINE HCL 20 MG PO CPEP
ORAL_CAPSULE | ORAL | Status: AC
  Filled 2014-03-18: qty 1

## 2014-03-18 MED ORDER — IBUPROFEN 200 MG PO TABS: 200.0000 mg | ORAL_TABLET | Freq: Four times a day (QID) | ORAL | Status: DC | PRN

## 2014-03-18 MED ORDER — ACETAMINOPHEN 325 MG PO TABS
ORAL_TABLET | ORAL | Status: AC
  Administered 2014-03-18: 650 mg
  Filled 2014-03-18: qty 2

## 2014-03-18 MED ORDER — INDOMETHACIN 25 MG PO CAPS
25.0000 mg | ORAL_CAPSULE | Freq: Once | ORAL | Status: DC
  Filled 2014-03-18: qty 1

## 2014-03-18 MED ORDER — DULOXETINE HCL 20 MG PO CPEP
20.0000 mg | ORAL_CAPSULE | Freq: Every day | ORAL | Status: DC
  Administered 2014-03-20: 20 mg via ORAL
  Filled 2014-03-18 (×4): qty 1

## 2014-03-18 NOTE — Progress Notes (Signed)
Patient ID: Donald ShinesRichard C Sullivan, male   DOB: Mar 16, 1981, 33 y.o.   MRN: [id number] D)  Has been out and about on the hall this evening, interacting appropriately with staff and peers.  Attended group this evening, participated, felt he has had a good day, working on developing more coping skills.  Has enjoyed the activities today of painting pumpkins, etc. And interacting in the milieu.  Has been pleasant and cooperative.   A)  Will continue to monitor for safety, support, encouragement, continue POC R)  Safety maintained.

## 2014-03-18 NOTE — Progress Notes (Signed)
[name] is seen OOB UAL on hte 300 hall today..he tolerates this very well. HE is composed. HE is articulate and pleasant and cooperative, even despite the fact that he is NOT being dc'Donald today.Marland Kitchen.as he had planned .   A HE completes his morning assessment and on it he writes he denies SI within the past 24 hrs  And he rates his depression, hopelessness and anxiety  Are " 0/0/0". Hwe takes his medications as scheduled and he is started on tylenol, cymbaltat and ondocin, to combat pain.   R Safety maintaiend.

## 2014-03-18 NOTE — Progress Notes (Signed)
Patient ID: [name] [place], male   DOB: 01-Dec-1980, 33 y.o.   MRN: [id number] [facility]  03/18/2014 5:46 PM [name] Voeltz  MRN:  [id number] Subjective: Pt seen and chart reviewed. Pt reports that he is doing much better overall from a psychiatric standpoint except for mild to moderate depression (improving with group therapy). Pt does report that he is concerned about his chronic pain management and is requesting an increase in medication.   Pt informed by this NP that there are many alternative medication management strategies to narcotics including medications that manage spasms, nerve inflammation, and inflammation. Pt very receptive to alternative therapies and was started on medications as listed below. Pt is currently denying SI, HI, and AVH.   Diagnosis:   DSM5:  Primary Psychiatric Diagnosis:  Bipolar disorder ,type I ,currently in major depressive episode   Secondary Psychiatric Diagnosis:  Anxiety disorder unspecified   Non Psychiatric Diagnosis:  See PMH  Total Time spent with patient: 45 minutes  ADL's:  Intact  Sleep: Fair  Appetite:  Fair   Psychiatric Specialty Exam: Physical Exam  ROS  Blood pressure 110/68, pulse 102, temperature 98.4 F (36.9 Sullivan), temperature source Oral, resp. rate 16, height 5\' 8"  (1.727 m), weight 68.04 kg (150 lb).Body mass index is 22.81 kg/(m^2).  General Appearance: Casual  Eye Contact::  Fair  Speech:  Clear and Coherent  Volume:  Increased  Mood:  Anxious, Depressed, Hopeless and Irritable (improving)  Affect:  Congruent  Thought Process:  Coherent  Orientation:  Full (Time, Place, and Person)  Thought Content:  WDL and rumination about pain management  Suicidal Thoughts:  No  Homicidal Thoughts:  No  Memory:  Immediate;   Fair Recent;   Fair Remote;   Fair  Judgement:  Impaired  Insight:  Lacking  Psychomotor Activity:  Normal  Concentration:  Fair  Recall:  [employer]  Fund of Knowledge:Fair  Language: Fair   Akathisia:  No  Handed:  Right  AIMS (if indicated):     Assets:  Communication Skills Desire for Improvement Housing Social Support  Sleep:  Number of Hours: 6   Musculoskeletal: Strength & Muscle Tone: within normal limits Gait & Station: normal Patient leans: N/A  Current Medications: Current Facility-Administered Medications  Medication Dose Route Frequency Provider Last Rate Last Dose  . alum & mag hydroxide-simeth (MAALOX/MYLANTA) 200-200-20 MG/5ML suspension 30 mL  30 mL Oral Q4H PRN Jomarie LongsSaramma Eappen, MD      . benztropine (COGENTIN) tablet 0.5 mg  0.5 mg Oral QHS Saramma Eappen, MD   0.5 mg at 03/17/14 2203  . divalproex (DEPAKOTE ER) 24 hr tablet 1,000 mg  1,000 mg Oral QHS Jomarie LongsSaramma Eappen, MD   1,000 mg at 03/17/14 2203  . DULoxetine (CYMBALTA) 20 MG DR capsule           . DULoxetine (CYMBALTA) DR capsule 20 mg  20 mg Oral Daily Beau FannyJohn Sullivan Withrow, FNP      . gabapentin (NEURONTIN) capsule 300 mg  300 mg Oral TID Jomarie LongsSaramma Eappen, MD   300 mg at 03/18/14 1528  . ibuprofen (ADVIL,MOTRIN) tablet 200 mg  200 mg Oral Q6H PRN Beau FannyJohn Sullivan Withrow, FNP      . indomethacin (INDOCIN) 25 MG capsule           . indomethacin (INDOCIN) capsule 25 mg  25 mg Oral BID Beau FannyJohn Sullivan Withrow, FNP      . indomethacin (INDOCIN) capsule 25 mg  25 mg Oral Once  Beau Fanny, FNP      . magnesium hydroxide (MILK OF MAGNESIA) suspension 30 mL  30 mL Oral Daily PRN Jomarie Longs, MD      . meloxicam (MOBIC) 7.5 MG tablet           . MUSCLE RUB CREA   Topical PRN Jomarie Longs, MD      . nicotine polacrilex (NICORETTE) gum 2 mg  2 mg Oral PRN Jomarie Longs, MD   2 mg at 03/18/14 0951  . OLANZapine zydis (ZYPREXA) disintegrating tablet 10 mg  10 mg Oral QHS Jomarie Longs, MD   10 mg at 03/17/14 2203  . oxyCODONE (Oxy IR/ROXICODONE) immediate release tablet 5 mg  5 mg Oral Q12H PRN Jomarie Longs, MD   5 mg at 03/18/14 1023  . pneumococcal 23 valent vaccine (PNU-IMMUNE) injection 0.5 mL  0.5 mL Intramuscular  Tomorrow-1000 Jomarie Longs, MD      . traZODone (DESYREL) tablet 100 mg  100 mg Oral QHS Jomarie Longs, MD   100 mg at 03/17/14 2203    Lab Results:  Results for orders placed during the hospital encounter of 03/16/14 (from the past 48 hour(s))  HEMOGLOBIN A1C     Status: None   Collection Time    03/16/14  7:09 PM      Result Value Ref Range   Hemoglobin A1C 5.2  <5.7 %   Comment: (NOTE)                                                                               According to the ADA Clinical Practice Recommendations for 2011, when     HbA1c is used as a screening test:      >=6.5%   Diagnostic of Diabetes Mellitus               (if abnormal result is confirmed)     5.7-6.4%   Increased risk of developing Diabetes Mellitus     References:Diagnosis and Classification of Diabetes Mellitus,Diabetes     Care,2011,34(Suppl 1):S62-S69 and Standards of Medical Care in             Diabetes - 2011,Diabetes Care,2011,34 (Suppl 1):S11-S61.   Mean Plasma Glucose 103  <117 mg/dL   Comment: Performed at [facility]  LIPID PANEL     Status: Abnormal   Collection Time    03/17/14  6:30 AM      Result Value Ref Range   Cholesterol 170  0 - 200 mg/dL   Triglycerides 161 (*) <150 mg/dL   HDL 40  >09 mg/dL   Total CHOL/HDL Ratio 4.3     VLDL 43 (*) 0 - 40 mg/dL   LDL Cholesterol 87  0 - 99 mg/dL   Comment:            Total Cholesterol/HDL:CHD Risk     Coronary Heart Disease Risk Table                         Men   Women      1/2 Average Risk   3.4   3.3      Average Risk  5.0   4.4      2 X Average Risk   9.6   7.1      3 X Average Risk  23.4   11.0                Use the calculated Patient Ratio     above and the CHD Risk Table     to determine the patient's CHD Risk.                ATP III CLASSIFICATION (LDL):      <100     mg/dL   Optimal      829-562  mg/dL   Near or Above                        Optimal      130-159  mg/dL   Borderline      130-865  mg/dL   High       >784     mg/dL   Very High     Performed at [facility]    Physical Findings: AIMS: Facial and Oral Movements Muscles of Facial Expression: None, normal Lips and Perioral Area: None, normal Jaw: None, normal Tongue: None, normal,Extremity Movements Upper (arms, wrists, hands, fingers): None, normal Lower (legs, knees, ankles, toes): None, normal, Trunk Movements Neck, shoulders, hips: None, normal, Overall Severity Severity of abnormal movements (highest score from questions above): None, normal Incapacitation due to abnormal movements: None, normal Patient's awareness of abnormal movements (rate only patient's report): No Awareness, Dental Status Current problems with teeth and/or dentures?: No Does patient usually wear dentures?: No  CIWA:    COWS:     Treatment Plan Summary: Daily contact with patient to assess and evaluate symptoms and progress in treatment Medication management  Plan/Assessment: Patient is a 33 year old CM, who has a hx of Bipolar disorder ,anxiety disorder ,presents after self reported suicide attempt by OD on pills ,reports psychosocial stressors of multiple deaths in the family ,his mother's death anniversary was yesterday ,she died a year ago from cancer.Pt also reports severe depressive sx,his medications not being effective.   Continue Zyprexa zydis to 10 mg po qhs for mood lability.  Continue Depakote  ER 1000 mg po qhs for seizure disorder /mood lability. Depakote level on 03/20/14 Continue Gabapentin to 300 mg po tid for pain ,anxiety sx.  Will make available prn medications for anxiety/agitation.  Continue Trazodone 100 mg po qhs for sleep.  -Add Indomethacin 25mg  bid for joint pain. -Decrease Advil to 200mg  q6h PRN for pain/headache -Add Cymbalta 20mg  daily for depression and chronic joint pain -Discontinue Lidoderm patch per pt request (pt refuses this as he states it is ineffective)  Will continue Oxycodone IR 5 mg po , but will  reduce to q12h prn. Pt educated regarding use of opioid pain medications, discussed risks,benefits with patient ,also discussed alternative forms of therapy.  Pt is trying minimize use. Will also provide Advil prn ,topical pain medications as well as heat application prn as well as lidocaine patch. 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  Will continue to monitor vitals ,medication compliance and treatment side effects while patient is here.  Will monitor for medical issues as well as call consult as needed.  Reviewed labs ,will order as needed.  CSW will start working on disposition.  Patient to participate in therapeutic milieu.      Medical  Decision Making Problem Points:  Established problem, stable/improving (1), Review of last therapy session (1) and Review of psycho-social stressors (1) Data Points:  Review and summation of old records (2) Review of medication regiment & side effects (2) Review of new medications or change in dosage (2)  I certify that inpatient services furnished can reasonably be expected to improve the patient's condition.   Beau FannyWithrow, John C, FNP-BC 03/18/2014, 5:46 PM

## 2014-03-18 NOTE — Progress Notes (Signed)
[facility] Group Notes:  (Nursing/MHT/Case Management/Adjunct)  Date:  03/18/2014  Time:  10:08 PM  Type of Therapy:  Psychoeducational Skills  Participation Level:  Active  Participation Quality:  Appropriate  Affect:  Appropriate  Cognitive:  Appropriate  Insight:  Good  Engagement in Group:  Engaged  Modes of Intervention:  Education  Summary of Progress/Problems: The patient expressed in group this evening that he had a good day since he attended all of his groups and because he socialized with his peers. In terms of the theme for the day, his coping skills will be to take his medication as prescribed and to attend all of his doctor's appointments.   Keiran Gaffey S 03/18/2014, 10:08 PM

## 2014-03-18 NOTE — [facility] Group Notes (Signed)
[facility] LCSW Group Therapy  03/18/2014 3:42 PM  Type of Therapy:  Group Therapy  Participation Level:  Active  Participation Quality:  Appropriate  Affect:  Appropriate  Cognitive:  Appropriate  Insight:  Engaged  Engagement in Therapy:  Engaged  Modes of Intervention:  Discussion  Summary of Progress/Problems: Group today was building coping skills by participants and how to use those coping mechanisms. Group began by defining coping mechanisms and then identify historical examples. There were multiple examples shared that others could find useful as a coping mechanism with tools for application. Patient was able to share how he used his coping mechanisms and learn from the coping mechanisms of the other group members.  Beverly Sessionsywan J Lindsey MSW, LCSW   Clide DalesHarrill, Amaan Meyer Campbell 03/18/2014, 3:42 PM

## 2014-03-18 NOTE — Progress Notes (Signed)
Patient did not attend AA meeting. Tonight we had a speaker meeting. Instead the patient slept. He was cooperative and pleasant on the unit tonight.   Jcion Buddenhagen A 1:08 AM

## 2014-03-19 DIAGNOSIS — F3132 Bipolar disorder, current episode depressed, moderate: Secondary | ICD-10-CM | POA: Insufficient documentation

## 2014-03-19 MED ORDER — INDOMETHACIN 25 MG PO CAPS
50.0000 mg | ORAL_CAPSULE | Freq: Two times a day (BID) | ORAL | Status: DC
  Administered 2014-03-19 – 2014-03-21 (×4): 50 mg via ORAL
  Filled 2014-03-19: qty 12
  Filled 2014-03-19 (×3): qty 1
  Filled 2014-03-19: qty 12
  Filled 2014-03-19 (×3): qty 1

## 2014-03-19 MED ORDER — OMEGA-3-ACID ETHYL ESTERS 1 G PO CAPS
1.0000 g | ORAL_CAPSULE | Freq: Three times a day (TID) | ORAL | Status: DC
  Administered 2014-03-19 – 2014-03-21 (×6): 1 g via ORAL
  Filled 2014-03-19: qty 9
  Filled 2014-03-19 (×3): qty 1
  Filled 2014-03-19: qty 9
  Filled 2014-03-19 (×2): qty 1
  Filled 2014-03-19: qty 9
  Filled 2014-03-19 (×4): qty 1

## 2014-03-19 MED ORDER — ACETAMINOPHEN 325 MG PO TABS
650.0000 mg | ORAL_TABLET | Freq: Four times a day (QID) | ORAL | Status: DC | PRN
  Administered 2014-03-19 – 2014-03-21 (×7): 650 mg via ORAL
  Filled 2014-03-19 (×6): qty 2

## 2014-03-19 NOTE — Progress Notes (Signed)
D Rick cont to grow on his coping skills, articulating his needs to the staff. HE attends his groups as planned and he is engaged in his poc.    A HE is taking his meds as planned, he is working on his hospital workbook and he is procesing with the staff. He allows this nurse to offer comfort as well as insight and he shares his feelings . He says he is " not sure" if tylenol added  back into his regimen, is helping, but he shares that he " will continue" because Renata Capriceonrad (NP) " told me it could take several days and  I wanted this to work"   Risk manager Safety is in place and poc moves forward.

## 2014-03-19 NOTE — Progress Notes (Signed)
Psychoeducational Group Note  Date: 03/19/2014 Time:  1015  Group Topic/Focus:  Identifying Needs:   The focus of this group is to help patients identify their personal needs that have been historically problematic and identify healthy behaviors to address their needs.  Participation Level:  Active  Participation Quality:  Appropriate  Affect:  Appropriate  Cognitive:  Oriented  Insight:  Improving  Engagement in Group:  Engaged  Additional Comments:    Cranford Blessinger A 

## 2014-03-19 NOTE — Progress Notes (Signed)
.  Psychoeducational Group Note  Late entry for 03-18-14 at 0900  Date: 03/19/2014 Time:  0900   Goal Setting Purpose of Group: To be able to set a goal that is measurable and that can be accomplished in one day Participation Level:  Active  Participation Quality:  Appropriate  Affect:  Depressed  Cognitive:  Oriented  Insight:  Improving  Engagement in Group:  Engaged  Additional Comments:   Kristofer Schaffert A 

## 2014-03-19 NOTE — Progress Notes (Signed)
Psychoeducational Group Note  Late entry on 03-19-2014 for 03-18-2014  Date:03/18/2014  Time:  1015  Group Topic/Focus:  Identifying Needs:   The focus of this group is to help patients identify their personal needs that have been historically problematic and identify healthy behaviors to address their needs.  Participation Level:  Active  Participation Quality:  Appropriate  Affect:  Appropriate  Cognitive:  Oriented  Insight:  Improving  Engagement in Group:  Engaged  Additional Comments:  Tamakia Porto A 

## 2014-03-19 NOTE — Progress Notes (Signed)
Patient ID: [name], male   DOB: [birth year], 33 y.o.   MRN: [id number] [facility]  03/19/2014 1:04 PM [name]  MRN:  [id number] Subjective: Pt seen and chart reviewed. Pt denies SI, HI, and AVH, contracts for safety. Pt reports a slight improvement in pain, but that it is still interfering with his ability to ambulate. Indomethacin adjusted, Advil discontinued, fish oil added. Pt is optimistic about his participation in group therapy and states that his sleep and appetite are doing well.    Diagnosis:   DSM5:  Primary Psychiatric Diagnosis:  Bipolar disorder ,type I ,currently in major depressive episode   Secondary Psychiatric Diagnosis:  Anxiety disorder unspecified   Non Psychiatric Diagnosis:  See PMH  Total Time spent with patient: 25 minutes  ADL's:  Intact  Sleep: Fair  Appetite:  Fair   Psychiatric Specialty Exam: Physical Exam  ROS  Blood pressure 128/73, pulse 63, temperature 97.7 F (36.5 Sullivan), temperature source Oral, resp. rate 18, height 5\' 8"  (1.727 m), weight 68.04 kg (150 lb).Body mass index is 22.81 kg/(m^2).  General Appearance: Casual  Eye Contact::  Fair  Speech:  Clear and Coherent  Volume:  Increased  Mood:  Anxious   Affect:  Congruent  Thought Process:  Coherent  Orientation:  Full (Time, Place, and Person)  Thought Content:  WDL   Suicidal Thoughts:  No  Homicidal Thoughts:  No  Memory:  Immediate;   Fair Recent;   Fair Remote;   Fair  Judgement:  Fair  Insight:  Fair and Lacking  Psychomotor Activity:  Normal  Concentration:  Fair  Recall:  [employer]  Fund of Knowledge:Fair  Language: Fair  Akathisia:  No  Handed:  Right  AIMS (if indicated):     Assets:  Communication Skills Desire for Improvement Housing Social Support  Sleep:  Number of Hours: 7.5   Musculoskeletal: Strength & Muscle Tone: within normal limits Gait & Station: normal Patient leans: N/A  Current Medications: Current Facility-Administered  Medications  Medication Dose Route Frequency Provider Last Rate Last Dose  . acetaminophen (TYLENOL) tablet 650 mg  650 mg Oral Q6H PRN Beau FannyJohn Sullivan Withrow, FNP      . alum & mag hydroxide-simeth (MAALOX/MYLANTA) 200-200-20 MG/5ML suspension 30 mL  30 mL Oral Q4H PRN Jomarie LongsSaramma Eappen, MD      . benztropine (COGENTIN) tablet 0.5 mg  0.5 mg Oral QHS Saramma Eappen, MD   0.5 mg at 03/18/14 2210  . divalproex (DEPAKOTE ER) 24 hr tablet 1,000 mg  1,000 mg Oral QHS Jomarie LongsSaramma Eappen, MD   1,000 mg at 03/18/14 2112  . DULoxetine (CYMBALTA) DR capsule 20 mg  20 mg Oral Daily Beau FannyJohn Sullivan Withrow, FNP      . gabapentin (NEURONTIN) capsule 300 mg  300 mg Oral TID Jomarie LongsSaramma Eappen, MD   300 mg at 03/19/14 1248  . indomethacin (INDOCIN) capsule 50 mg  50 mg Oral BID Beau FannyJohn Sullivan Withrow, FNP      . magnesium hydroxide (MILK OF MAGNESIA) suspension 30 mL  30 mL Oral Daily PRN Jomarie LongsSaramma Eappen, MD      . MUSCLE RUB CREA   Topical PRN Jomarie LongsSaramma Eappen, MD      . nicotine polacrilex (NICORETTE) gum 2 mg  2 mg Oral PRN Jomarie LongsSaramma Eappen, MD   2 mg at 03/19/14 1247  . OLANZapine zydis (ZYPREXA) disintegrating tablet 10 mg  10 mg Oral QHS Jomarie LongsSaramma Eappen, MD   10 mg at 03/18/14 2211  .  omega-3 acid ethyl esters (LOVAZA) capsule 1 g  1 g Oral TID WC [name], FNP      . oxyCODONE (Oxy IR/ROXICODONE) immediate release tablet 5 mg  5 mg Oral Q12H PRN Jomarie LongsSaramma Eappen, MD   5 mg at 03/19/14 0954  . pneumococcal 23 valent vaccine (PNU-IMMUNE) injection 0.5 mL  0.5 mL Intramuscular Tomorrow-1000 Saramma Eappen, MD      . traZODone (DESYREL) tablet 100 mg  100 mg Oral QHS Jomarie LongsSaramma Eappen, MD   100 mg at 03/18/14 2210    Lab Results:  No results found for this or any previous visit (from the past 48 hour(s)).  Physical Findings: AIMS: Facial and Oral Movements Muscles of Facial Expression: None, normal Lips and Perioral Area: None, normal Jaw: None, normal Tongue: None, normal,Extremity Movements Upper (arms, wrists, hands, fingers): None,  normal Lower (legs, knees, ankles, toes): None, normal, Trunk Movements Neck, shoulders, hips: None, normal, Overall Severity Severity of abnormal movements (highest score from questions above): None, normal Incapacitation due to abnormal movements: None, normal Patient's awareness of abnormal movements (rate only patient's report): No Awareness, Dental Status Current problems with teeth and/or dentures?: No Does patient usually wear dentures?: No  CIWA:    COWS:     Treatment Plan Summary: Daily contact with patient to assess and evaluate symptoms and progress in treatment Medication management  Plan/Assessment: Patient is a 33 year old CM, who has a hx of Bipolar disorder ,anxiety disorder ,presents after self reported suicide attempt by OD on pills ,reports psychosocial stressors of multiple deaths in the family ,his mother's death anniversary was yesterday ,she died a year ago from cancer.Pt also reports severe depressive sx,his medications not being effective.   Continue Zyprexa zydis to 10 mg po qhs for mood lability.  Continue Depakote  ER 1000 mg po qhs for seizure disorder /mood lability. Depakote level on 03/20/14 Continue Gabapentin to 300 mg po tid for pain ,anxiety sx.  Will make available prn medications for anxiety/agitation.  Continue Trazodone 100 mg po qhs for sleep.  -Increase Indomethacin to 50mg  bid for joint pain. -Discontinue Advil due to increase in other NSAID -Continue Cymbalta 20mg  daily for depression and chronic joint pain -Lovaza 1g tid wc for inflammation/pain  Will continue Oxycodone IR 5 mg po , but will reduce to q12h prn. Pt educated regarding use of opioid pain medications, discussed risks,benefits with patient ,also discussed alternative forms of therapy.  Pt is trying minimize use. Will also provide Advil prn ,topical pain medications as well as heat application prn as well as lidocaine patch. Used to be on suboxone in the past -last use June  2015.  Will continue to monitor vitals ,medication compliance and treatment side effects while patient is here.  Will monitor for medical issues as well as call consult as needed.  Reviewed labs ,will order as needed.  CSW will start working on disposition.  Patient to participate in therapeutic milieu.      Medical Decision Making Problem Points:  Established problem, stable/improving (1), Review of last therapy session (1) and Review of psycho-social stressors (1) Data Points:  Review and summation of old records (2) Review of medication regiment & side effects (2) Review of new medications or change in dosage (2)  I certify that inpatient services furnished can reasonably be expected to improve the patient's condition.   Beau FannyWithrow, John C, FNP-BC 03/19/2014, 1:04 PM

## 2014-03-19 NOTE — [facility] Group Notes (Signed)
[facility] LCSW Group Therapy 03/19/2014   Type of Therapy: Group Therapy- Feelings Around Discharge & Establishing a Supportive Framework  Participation Level: Active   Participation Quality:  Appropriate  Affect:  Appropriate   Cognitive: Alert and Oriented   Insight:  Developing/Improving   Engagement in Therapy: Developing/Improving and Engaged   Modes of Intervention: Clarification, Confrontation, Discussion, Education, Exploration, Limit-setting, Orientation, Problem-solving, Rapport Building, Dance movement psychotherapisteality Testing, Socialization and Support   Description of Group:   What is a supportive framework? What does it look like feel like and how do I discern it from and unhealthy non-supportive network? Learn how to cope when supports are not helpful and don't support you. Discuss what to do when your family/friends are not supportive. Pt participated actively in group discussion, exploring the importance of trustworthiness and reliability in a positive support. Pt discussed the difficult relationship he has with his mother-in-law as he feels she is too involved in his life and proceeds to gossip about it to others. Pt identified his wife as his main support, reporting that she has had unconditional support for him. Pt discussed changing people, places, and things in his life previously in order to have a successful recovery. Pt was receptive to feedback from peers.    Therapeutic Modalities:   Cognitive Behavioral Therapy Person-Centered Therapy Motivational Interviewing   Chad CordialLauren Carter, LCSWA 03/19/2014 11:20 AM

## 2014-03-20 DIAGNOSIS — F411 Generalized anxiety disorder: Secondary | ICD-10-CM

## 2014-03-20 DIAGNOSIS — F419 Anxiety disorder, unspecified: Secondary | ICD-10-CM | POA: Insufficient documentation

## 2014-03-20 LAB — VALPROIC ACID LEVEL: Valproic Acid Lvl: 33.6 ug/mL — ABNORMAL LOW (ref 50.0–100.0)

## 2014-03-20 MED ORDER — DULOXETINE HCL 20 MG PO CPEP
20.0000 mg | ORAL_CAPSULE | Freq: Two times a day (BID) | ORAL | Status: DC
Start: 2014-03-20 — End: 2014-03-21
  Administered 2014-03-20 – 2014-03-21 (×2): 20 mg via ORAL
  Filled 2014-03-20 (×4): qty 1

## 2014-03-20 MED ORDER — DULOXETINE HCL 20 MG PO CPEP
20.0000 mg | ORAL_CAPSULE | Freq: Every day | ORAL | Status: DC
  Filled 2014-03-20 (×2): qty 1

## 2014-03-20 MED ORDER — DULOXETINE HCL 20 MG PO CPEP
20.0000 mg | ORAL_CAPSULE | Freq: Two times a day (BID) | ORAL | Status: DC
  Administered 2014-03-21: 20 mg via ORAL
  Filled 2014-03-20 (×2): qty 1
  Filled 2014-03-20: qty 6
  Filled 2014-03-20: qty 1
  Filled 2014-03-20: qty 6

## 2014-03-20 MED ORDER — DULOXETINE HCL 20 MG PO CPEP: 20.0000 mg | ORAL_CAPSULE | Freq: Two times a day (BID) | ORAL | Status: DC

## 2014-03-20 MED ORDER — TRAZODONE HCL 100 MG PO TABS
200.0000 mg | ORAL_TABLET | Freq: Every day | ORAL | Status: DC
  Administered 2014-03-20: 200 mg via ORAL
  Filled 2014-03-20 (×2): qty 2
  Filled 2014-03-20: qty 6

## 2014-03-20 NOTE — Progress Notes (Signed)
Patient ID: Donald ShinesRichard C Sullivan, male   DOB: [birth year], 33 y.o.   MRN: [id number] D)  Has been out and about this evening, participating in the milieu, affect seems brighter and hopeful.  Talked about working with Renata Capriceonrad to get his meds adjusted, and is hopeful the changes they are making will help.  Talked about how far he has come in regard to weaning down the amount of pain meds he had been taking.  Attended group this evening, and has been interacting appropriately with staff and peers. A) Support and encouragement, praise for his progress, will continue to monitor for safety, continue POC R)  Safety maintained.

## 2014-03-20 NOTE — [facility] Group Notes (Signed)
   [facility]  03/20/2014  8:45 AM   Participation Quality: Alert, Appropriate and Oriented  Mood/Affect: Depressed and Flat  Depression Rating: 2  Anxiety Rating: 1  Thoughts of Suicide: Pt denies SI/HI  Will you contract for safety? Yes  Current AVH: Pt denies  Plan for Discharge/Comments: Pt attended discharge planning group and actively participated in group. CSW provided pt with today's workbook.  Patient reports feeling "so/so" today. Patient reports experiencing low levels of depression and anxiety. He plans to return home to follow up with [facility] for outpatient services.  Transportation Means: Pt reports access to transportation  Supports: No supports mentioned at this time  Samuella BruinKristin Zayonna Ayuso, MSW, [employer] IncLCSWA Clinical Social Worker [facility] [phone]

## 2014-03-20 NOTE — [facility] Group Notes (Signed)
[facility] LCSW Group Therapy 03/20/2014  1:15 pm  Type of Therapy: Group Therapy Participation Level: Active  Participation Quality: Attentive, Sharing and Supportive  Affect: Depressed and Flat  Cognitive: Alert and Oriented  Insight: Developing/Improving and Engaged  Engagement in Therapy: Developing/Improving and Engaged  Modes of Intervention: Clarification, Confrontation, Discussion, Education, Exploration,  Limit-setting, Orientation, Problem-solving, Rapport Building, Dance movement psychotherapisteality Testing, Socialization and Support  Summary of Progress/Problems: Pt identified obstacles faced currently and processed barriers involved in overcoming these obstacles. Pt identified steps necessary for overcoming these obstacles and explored motivation (internal and external) for facing these difficulties head on. Pt further identified one area of concern in their lives and chose a goal to focus on for today. Patient reports that his primary obstacle is learning healthy coping skills. CSW and patient explored healthy coping skills that he could utilize such as hobbies and therapy. CSW and other group members provided emotional support and encouragement.  Samuella BruinKristin Ritta Hammes, MSW, [employer]CSWA Clinical Social Worker [facility] [phone]

## 2014-03-20 NOTE — Progress Notes (Signed)
D:  Patient's self inventory sheet, patient has fair sleep, sleep medication was not helpful.  Fair appetite, low energy level, good concentration.  Rated depression 5, denied hopeless, anxiety 4.  Denied withdrawals.  Denied SI.  Physical problems of pain in hips/knees.  Pain medication is helpful.  Goal is to go to groups.  Does have discharge plans.   A:  Medications administered per MD orders.  Emotional support and encouragement given patient. R:  Denied SI and HI, contracts for safety.  Safety maintained with 15 minute checks.

## 2014-03-20 NOTE — Progress Notes (Signed)
Patient did not attend the evening speaker AA meeting. Pt reported having heard the speaker numerous times but did enter the last 15 min and was attentive.

## 2014-03-20 NOTE — Plan of Care (Signed)
Problem: Consults Goal: Depression Patient Education See Patient Education Module for education specifics.  Outcome: Completed/Met Date Met:  03/20/14 Patient will work on his depression, discuss with staff.  Patient stated he was feeling better today.

## 2014-03-20 NOTE — Progress Notes (Signed)
[facility]  03/20/2014 5:38 PM Donald ShinesRichard C Sullivan  MRN:  [id number] Subjective:  States he is still feeling somewhat depressed. He did not sleep too well last night. States he was actually taking a higher dose of Trazodone. He states he felt some improvement when he was placed on the Cymbalta 20 mg (asks if it could be increased) Diagnosis:   DSM5: Depressive Disorders:  Major Depressive Disorder - Moderate (296.22) Total Time spent with patient: 30 minutes  Axis I: Bipolar, Depressed and Generalized Anxiety Disorder  ADL's:  Intact  Sleep: Poor  Appetite:  Fair  Psychiatric Specialty Exam: Physical Exam  Review of Systems  Constitutional: Negative.   Eyes: Negative.   Respiratory: Negative.   Cardiovascular: Negative.   Gastrointestinal: Negative.   Musculoskeletal: Positive for back pain.  Skin: Negative.   Neurological: Positive for headaches.  Endo/Heme/Allergies: Negative.   Psychiatric/Behavioral: Positive for depression. The patient is nervous/anxious.     Blood pressure 133/71, pulse 67, temperature 98.1 F (36.7 C), temperature source Oral, resp. rate 16, height 5\' 8"  (1.727 m), weight 68.04 kg (150 lb).Body mass index is 22.81 kg/(m^2).  General Appearance: Fairly Groomed  Patent attorneyye Contact::  Fair  Speech:  Clear and Coherent  Volume:  Normal  Mood:  Anxious and Depressed  Affect:  anxious worried  Thought Process:  Coherent and Goal Directed  Orientation:  Full (Time, Place, and Person)  Thought Content:  symptoms worries concerns  Suicidal Thoughts:  No  Homicidal Thoughts:  No  Memory:  Immediate;   Fair Recent;   Fair Remote;   Fair  Judgement:  Fair  Insight:  Present  Psychomotor Activity:  Restlessness  Concentration:  Fair  Recall:  [employer]Fair  Fund of Knowledge:NA  Language: Fair  Akathisia:  No  Handed:    AIMS (if indicated):     Assets:  Desire for Improvement Housing Social Support  Sleep:  Number of Hours: 5.75    Musculoskeletal: Strength & Muscle Tone: within normal limits Gait & Station: normal Patient leans: N/A  Current Medications: Current Facility-Administered Medications  Medication Dose Route Frequency Provider Last Rate Last Dose  . acetaminophen (TYLENOL) tablet 650 mg  650 mg Oral Q6H PRN Beau FannyJohn C Withrow, FNP   650 mg at 03/20/14 1514  . alum & mag hydroxide-simeth (MAALOX/MYLANTA) 200-200-20 MG/5ML suspension 30 mL  30 mL Oral Q4H PRN Jomarie LongsSaramma Eappen, MD      . benztropine (COGENTIN) tablet 0.5 mg  0.5 mg Oral QHS Saramma Eappen, MD   0.5 mg at 03/19/14 2146  . divalproex (DEPAKOTE ER) 24 hr tablet 1,000 mg  1,000 mg Oral QHS Jomarie LongsSaramma Eappen, MD   1,000 mg at 03/19/14 2145  . [START ON 03/21/2014] DULoxetine (CYMBALTA) DR capsule 20 mg  20 mg Oral BID Rachael FeeIrving A Havilah Topor, MD      . DULoxetine (CYMBALTA) DR capsule 20 mg  20 mg Oral BID Jomarie LongsSaramma Eappen, MD   20 mg at 03/20/14 1701  . gabapentin (NEURONTIN) capsule 300 mg  300 mg Oral TID Jomarie LongsSaramma Eappen, MD   300 mg at 03/20/14 1701  . indomethacin (INDOCIN) capsule 50 mg  50 mg Oral BID Beau FannyJohn C Withrow, FNP   50 mg at 03/20/14 0747  . magnesium hydroxide (MILK OF MAGNESIA) suspension 30 mL  30 mL Oral Daily PRN Jomarie LongsSaramma Eappen, MD      . MUSCLE RUB CREA   Topical PRN Jomarie LongsSaramma Eappen, MD      . nicotine polacrilex (NICORETTE)  gum 2 mg  2 mg Oral PRN Jomarie LongsSaramma Eappen, MD   2 mg at 03/20/14 1426  . OLANZapine zydis (ZYPREXA) disintegrating tablet 10 mg  10 mg Oral QHS Jomarie LongsSaramma Eappen, MD   10 mg at 03/19/14 2146  . omega-3 acid ethyl esters (LOVAZA) capsule 1 g  1 g Oral TID WC Beau FannyJohn C Withrow, FNP   1 g at 03/20/14 1700  . oxyCODONE (Oxy IR/ROXICODONE) immediate release tablet 5 mg  5 mg Oral Q12H PRN Jomarie LongsSaramma Eappen, MD   5 mg at 03/20/14 1006  . traZODone (DESYREL) tablet 200 mg  200 mg Oral QHS Rachael FeeIrving A Zamier Eggebrecht, MD        Lab Results:  Results for orders placed or performed during the hospital encounter of 03/16/14 (from the past 48 hour(s))  Valproic  acid level     Status: Abnormal   Collection Time: 03/20/14  6:30 AM  Result Value Ref Range   Valproic Acid Lvl 33.6 (L) 50.0 - 100.0 ug/mL    Comment: [facility]    Physical Findings: AIMS: Facial and Oral Movements Muscles of Facial Expression: None, normal Lips and Perioral Area: None, normal Jaw: None, normal Tongue: None, normal,Extremity Movements Upper (arms, wrists, hands, fingers): None, normal Lower (legs, knees, ankles, toes): None, normal, Trunk Movements Neck, shoulders, hips: None, normal, Overall Severity Severity of abnormal movements (highest score from questions above): None, normal Incapacitation due to abnormal movements: None, normal Patient's awareness of abnormal movements (rate only patient's report): No Awareness, Dental Status Current problems with teeth and/or dentures?: No Does patient usually wear dentures?: No  CIWA:  CIWA-Ar Total: 1 COWS:  COWS Total Score: 4  Treatment Plan Summary: Daily contact with patient to assess and evaluate symptoms and progress in treatment Medication management  Plan: Supportive approach/coping skills           Will increase the Trazodone to the 200 mg dose           Will increase the Cymbalta to 20 mg BID  Medical Decision Making Problem Points:  Review of psycho-social stressors (1) Data Points:  Review of medication regiment & side effects (2) Review of new medications or change in dosage (2)  I certify that inpatient services furnished can reasonably be expected to improve the patient's condition.   Keona Bilyeu A 03/20/2014, 5:38 PM

## 2014-03-21 ENCOUNTER — Ambulatory Visit: Payer: Self-pay | Admitting: Neurology

## 2014-03-21 MED ORDER — OMEGA-3-ACID ETHYL ESTERS 1 G PO CAPS: 1.0000 g | ORAL_CAPSULE | Freq: Three times a day (TID) | ORAL | Status: AC

## 2014-03-21 MED ORDER — OLANZAPINE 10 MG PO TBDP: 10.0000 mg | ORAL_TABLET | Freq: Every day | ORAL | Status: DC

## 2014-03-21 MED ORDER — GABAPENTIN 300 MG PO CAPS: 300.0000 mg | ORAL_CAPSULE | Freq: Three times a day (TID) | ORAL | Status: DC

## 2014-03-21 MED ORDER — DIVALPROEX SODIUM ER 500 MG PO TB24: 1000.0000 mg | ORAL_TABLET | Freq: Every day | ORAL | Status: DC

## 2014-03-21 MED ORDER — TRAZODONE HCL 100 MG PO TABS: 200.0000 mg | ORAL_TABLET | Freq: Every day | ORAL | Status: DC

## 2014-03-21 MED ORDER — INDOMETHACIN 50 MG PO CAPS: 50.0000 mg | ORAL_CAPSULE | Freq: Two times a day (BID) | ORAL | Status: AC

## 2014-03-21 MED ORDER — DULOXETINE HCL 20 MG PO CPEP: 20.0000 mg | ORAL_CAPSULE | Freq: Two times a day (BID) | ORAL | Status: DC

## 2014-03-21 MED ORDER — BENZTROPINE MESYLATE 0.5 MG PO TABS: 0.5000 mg | ORAL_TABLET | Freq: Every day | ORAL | Status: DC
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
## 2014-03-21 NOTE — Progress Notes (Signed)
Recreation Therapy Notes  Animal-Assisted Activity/Therapy (AAA/T) Program Checklist/Progress Notes Patient Eligibility Criteria Checklist & Daily Group note for Rec Tx Intervention  Date: 11.03.2015 Time: 2:45pm Location: [address] Day[address]   AAA/T Program Assumption of Risk Form signed by Patient/ or Parent Legal Guardian yes  Patient is free of allergies or sever asthma yes  Patient reports no fear of animals yes  Patient reports no history of cruelty to animals yes   Patient understands his/her participation is voluntary yes  Behavioral Response: Did not attend.   Camerin Jimenez L Delane Stalling, LRT/CTRS  Amaura Authier L 03/21/2014 4:21 PM 
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
## 2014-03-21 NOTE — Progress Notes (Signed)
D   Pt is pleasant and appropriate   He interacts appropriately with staff and peers     Pt is scheduled for discharge tomorrow and reports he is ready for same    He attends and participates in groups and other unit activities A   Verbal support given   Medications administered and effectiveness monitored   Q 15 min checks R   Pt safe at present

## 2014-03-21 NOTE — [facility] Group Notes (Signed)
The focus of this group is to educate the patient on the purpose and policies of crisis stabilization and provide a format to answer questions about their admission.  The group details unit policies and expectations of patients while admitted.  Patient attended 0900 nurse education orientation group this morning.  Patient actively participated, appropriate affect, alert, appropriate insight and engagement.  Today patient will work on 3 goals for discharge.  

## 2014-03-21 NOTE — Progress Notes (Signed)
[facility] :  Will you be returning to the same living situation after discharge: Yes,  home with family At discharge, do you have transportation home?:Yes,  pt's wife Do you have the ability to pay for your medications:Yes,  [facility]  Release of information consent forms completed and submitted to Medical Records by CSW.  Patient to Follow up at: Follow-up Information    Follow up with [facility] On 03/24/2014.   Why:  Appt with Dr. Jannifer FranklinAkintayo at 4:00PM on this date.    Contact information:   ATTN: Thedore MinsMojeed Akintayo, MD [address] [place], [place] [address] Phone: [phone] Fax: [phone]      Follow up with [facility] On 03/24/2014.   Why:  Appt with Christine for therapy at 3:00PM on this date.    Contact information:   [address] [place], [place] [address] Phone: [phone] Fax: [phone]      Patient denies SI/HI:   Yes,  during group/self report.     Safety Planning and Suicide Prevention discussed:  Yes,  SPE completed with pt as he did not consent to family contact. SPI pamphlet provided to pt and he was encouraged to share information with support network, ask questions, and talk about any concerns relating to SPE.   Smart, Falicity Sheets LCSWA  03/21/2014, 10:18 AM

## 2014-03-21 NOTE — Progress Notes (Signed)
Patient ID: [name], male   DOB: [birth year], 33 y.o.   MRN: [id number] Discharge note for 1430: late charting. He has been up and to groups today interacting with peers and staff, Has requested and received prn medication for pain  That was effenctive this AM. He was discharged home and was picked up by his wife. He voiced understanding of discharge teaching about follow up appointments and medications. He denies thoughts of SI and all his belongings were taken  Home with him. He was pleasant and cooperative and spoke about his taking care of his family.

## 2014-03-24 NOTE — Progress Notes (Signed)
Patient Discharge Instructions:  After Visit Summary (AVS):   Faxed to:  03/24/14 Discharge Summary Note:   Faxed to:  03/24/14 Psychiatric Admission Assessment Note:   Faxed to:  03/24/14 Suicide Risk Assessment - Discharge Assessment:   Faxed to:  03/24/14 Faxed/Sent to the Next Level Care provider:  03/24/14 Faxed to [facility] - Dr. Jannifer FranklinAkintayo @ [phone]  Jerelene ReddenSheena E [place], 03/24/2014, 3:23 PM

## 2014-03-29 ENCOUNTER — Encounter (HOSPITAL_COMMUNITY): Payer: Self-pay | Admitting: Emergency Medicine

## 2014-03-29 ENCOUNTER — Emergency Department (HOSPITAL_COMMUNITY)
Admission: EM | Admit: 2014-03-29 | Discharge: 2014-03-30 | Disposition: A | Discharge status: Other Acute Inpatient Hospital | Payer: Medicaid Other | Attending: Emergency Medicine | Admitting: Emergency Medicine

## 2014-03-29 DIAGNOSIS — F3132 Bipolar disorder, current episode depressed, moderate: Secondary | ICD-10-CM | POA: Diagnosis present

## 2014-03-29 DIAGNOSIS — Z87828 Personal history of other (healed) physical injury and trauma: Secondary | ICD-10-CM | POA: Diagnosis not present

## 2014-03-29 DIAGNOSIS — Z8781 Personal history of (healed) traumatic fracture: Secondary | ICD-10-CM | POA: Diagnosis not present

## 2014-03-29 DIAGNOSIS — F419 Anxiety disorder, unspecified: Secondary | ICD-10-CM | POA: Diagnosis present

## 2014-03-29 DIAGNOSIS — R45851 Suicidal ideations: Secondary | ICD-10-CM | POA: Diagnosis present

## 2014-03-29 DIAGNOSIS — Z79899 Other long term (current) drug therapy: Secondary | ICD-10-CM | POA: Insufficient documentation

## 2014-03-29 DIAGNOSIS — G40909 Epilepsy, unspecified, not intractable, without status epilepticus: Secondary | ICD-10-CM | POA: Diagnosis not present

## 2014-03-29 DIAGNOSIS — Z9151 Personal history of suicidal behavior: Secondary | ICD-10-CM

## 2014-03-29 DIAGNOSIS — F1010 Alcohol abuse, uncomplicated: Secondary | ICD-10-CM | POA: Diagnosis present

## 2014-03-29 DIAGNOSIS — Z915 Personal history of self-harm: Secondary | ICD-10-CM

## 2014-03-29 DIAGNOSIS — Z720 Tobacco use: Secondary | ICD-10-CM | POA: Insufficient documentation

## 2014-03-29 LAB — CBC
HCT: 38.5 % — ABNORMAL LOW (ref 39.0–52.0)
Hemoglobin: 13.5 g/dL (ref 13.0–17.0)
MCH: 32.6 pg (ref 26.0–34.0)
MCHC: 35.1 g/dL (ref 30.0–36.0)
MCV: 93 fL (ref 78.0–100.0)
Platelets: 399 10*3/uL (ref 150–400)
RBC: 4.14 MIL/uL — ABNORMAL LOW (ref 4.22–5.81)
RDW: 12.2 % (ref 11.5–15.5)
WBC: 10.4 10*3/uL (ref 4.0–10.5)

## 2014-03-29 LAB — COMPREHENSIVE METABOLIC PANEL
ALBUMIN: 3.6 g/dL (ref 3.5–5.2)
ALT: 9 U/L (ref 0–53)
ANION GAP: 14 (ref 5–15)
AST: 13 U/L (ref 0–37)
Alkaline Phosphatase: 75 U/L (ref 39–117)
BUN: 11 mg/dL (ref 6–23)
CALCIUM: 9.4 mg/dL (ref 8.4–10.5)
CO2: 24 mEq/L (ref 19–32)
CREATININE: 0.66 mg/dL (ref 0.50–1.35)
Chloride: 107 mEq/L (ref 96–112)
GFR calc Af Amer: >90 mL/min (ref >90)
GFR calc non Af Amer: >90 mL/min (ref >90)
Glucose, Bld: 98 mg/dL (ref 70–99)
Potassium: 4.1 mEq/L (ref 3.7–5.3)
Sodium: 145 mEq/L (ref 137–147)
TOTAL PROTEIN: 7.3 g/dL (ref 6.0–8.3)
Total Bilirubin: <0.2 mg/dL — ABNORMAL LOW (ref 0.3–1.2)

## 2014-03-29 LAB — ACETAMINOPHEN LEVEL

## 2014-03-29 LAB — SALICYLATE LEVEL: Salicylate Lvl: <2 mg/dL — ABNORMAL LOW (ref 2.8–20.0)

## 2014-03-29 LAB — ETHANOL: ALCOHOL ETHYL (B): 33 mg/dL — AB (ref 0–11)

## 2014-03-29 MED ORDER — ACETAMINOPHEN 325 MG PO TABS: 650.0000 mg | ORAL_TABLET | Freq: Once | ORAL | Status: DC

## 2014-03-29 MED ORDER — LORAZEPAM 1 MG PO TABS: 1.0000 mg | ORAL_TABLET | Freq: Three times a day (TID) | ORAL | Status: DC | PRN

## 2014-03-29 MED ORDER — ALUM & MAG HYDROXIDE-SIMETH 200-200-20 MG/5ML PO SUSP: 30.0000 mL | ORAL | Status: DC | PRN

## 2014-03-29 MED ORDER — ONDANSETRON HCL 4 MG PO TABS: 4.0000 mg | ORAL_TABLET | Freq: Three times a day (TID) | ORAL | Status: DC | PRN

## 2014-03-29 MED ORDER — ACETAMINOPHEN 325 MG PO TABS: 650.0000 mg | ORAL_TABLET | ORAL | Status: DC | PRN

## 2014-03-29 MED ORDER — NICOTINE 21 MG/24HR TD PT24
21.0000 mg | MEDICATED_PATCH | Freq: Every day | TRANSDERMAL | Status: DC
  Filled 2014-03-29: qty 1

## 2014-03-29 MED ORDER — ZOLPIDEM TARTRATE 5 MG PO TABS: 5.0000 mg | ORAL_TABLET | Freq: Every evening | ORAL | Status: DC | PRN

## 2014-03-29 MED ORDER — IBUPROFEN 200 MG PO TABS: 600.0000 mg | ORAL_TABLET | Freq: Three times a day (TID) | ORAL | Status: DC | PRN

## 2014-03-29 NOTE — ED Notes (Signed)
Patient reminded to provide urine sample.

## 2014-03-29 NOTE — ED Notes (Signed)
Patient endorses SI. Denies HI, AVH. Rates feelings of anxiety 3/10, feelings of depression 8/10. Reports chronic bilateral knee and hip pain. Patient is flat, minimal communication with Clinical research associatewriter.  Encouragement offered. Patient declines pain intervention at present.  Q 15 safety checks continue.

## 2014-03-29 NOTE — ED Notes (Signed)
Pt's wife at bedside, states [facility] over the past 5 weeks. Hx of multiple suicide attempts, pt's wife states she has had to do CPR on him before after suicide attempt. Pt today reports thoughts of killing himself, denies any plan at this time. Pt's wife wants pt to also get treatment for substance abuse. Hx of narcotic pain medicine abuse, but she states since he no longer has been medicine available to him he has been abusing alcohol. Pt reports drinking 4 beers today, states he does not think he needs detox. Pt expresses no desire for substance abuse tx at this time. Pt is here voluntarily.
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
## 2014-03-30 ENCOUNTER — Inpatient Hospital Stay (HOSPITAL_COMMUNITY)
Admission: AD | Admit: 2014-03-30 | Discharge: 2014-04-06 | DRG: 885 | Disposition: A | Discharge status: Home / Self Care | Payer: Medicaid Other | Source: Intra-hospital | Attending: Psychiatry | Admitting: Psychiatry

## 2014-03-30 ENCOUNTER — Encounter (HOSPITAL_COMMUNITY): Payer: Self-pay | Admitting: Psychiatry

## 2014-03-30 DIAGNOSIS — Z79899 Other long term (current) drug therapy: Secondary | ICD-10-CM

## 2014-03-30 DIAGNOSIS — F1010 Alcohol abuse, uncomplicated: Secondary | ICD-10-CM | POA: Diagnosis present

## 2014-03-30 DIAGNOSIS — F322 Major depressive disorder, single episode, severe without psychotic features: Secondary | ICD-10-CM | POA: Diagnosis present

## 2014-03-30 DIAGNOSIS — F3132 Bipolar disorder, current episode depressed, moderate: Secondary | ICD-10-CM | POA: Diagnosis not present

## 2014-03-30 DIAGNOSIS — G40909 Epilepsy, unspecified, not intractable, without status epilepticus: Secondary | ICD-10-CM | POA: Diagnosis present

## 2014-03-30 DIAGNOSIS — F17210 Nicotine dependence, cigarettes, uncomplicated: Secondary | ICD-10-CM | POA: Diagnosis present

## 2014-03-30 DIAGNOSIS — G8929 Other chronic pain: Secondary | ICD-10-CM | POA: Diagnosis present

## 2014-03-30 DIAGNOSIS — R45851 Suicidal ideations: Secondary | ICD-10-CM | POA: Diagnosis present

## 2014-03-30 DIAGNOSIS — F329 Major depressive disorder, single episode, unspecified: Secondary | ICD-10-CM | POA: Diagnosis present

## 2014-03-30 DIAGNOSIS — F3130 Bipolar disorder, current episode depressed, mild or moderate severity, unspecified: Secondary | ICD-10-CM | POA: Diagnosis present

## 2014-03-30 DIAGNOSIS — T50902A Poisoning by unspecified drugs, medicaments and biological substances, intentional self-harm, initial encounter: Secondary | ICD-10-CM | POA: Diagnosis present

## 2014-03-30 DIAGNOSIS — F314 Bipolar disorder, current episode depressed, severe, without psychotic features: Principal | ICD-10-CM | POA: Diagnosis present

## 2014-03-30 DIAGNOSIS — F332 Major depressive disorder, recurrent severe without psychotic features: Secondary | ICD-10-CM | POA: Insufficient documentation

## 2014-03-30 LAB — RAPID URINE DRUG SCREEN, HOSP PERFORMED
Amphetamines: NOT DETECTED
Barbiturates: NOT DETECTED
Benzodiazepines: NOT DETECTED
Cocaine: NOT DETECTED
OPIATES: NOT DETECTED
Tetrahydrocannabinol: NOT DETECTED

## 2014-03-30 MED ORDER — GABAPENTIN 300 MG PO CAPS
300.0000 mg | ORAL_CAPSULE | Freq: Three times a day (TID) | ORAL | Status: DC
  Administered 2014-03-30 (×2): 300 mg via ORAL
  Filled 2014-03-30 (×2): qty 1

## 2014-03-30 MED ORDER — BENZTROPINE MESYLATE 0.5 MG PO TABS
0.5000 mg | ORAL_TABLET | Freq: Every day | ORAL | Status: DC
Start: 2014-03-31 — End: 2014-03-31
  Administered 2014-03-31: 0.5 mg via ORAL
  Filled 2014-03-30 (×4): qty 1

## 2014-03-30 MED ORDER — OMEGA-3-ACID ETHYL ESTERS 1 G PO CAPS
1.0000 g | ORAL_CAPSULE | Freq: Every day | ORAL | Status: DC
Start: 2014-03-30 — End: 2014-03-30
  Administered 2014-03-30: 1 g via ORAL
  Filled 2014-03-30: qty 1

## 2014-03-30 MED ORDER — LITHIUM CARBONATE ER 300 MG PO TBCR
300.0000 mg | EXTENDED_RELEASE_TABLET | Freq: Two times a day (BID) | ORAL | Status: DC
Start: 2014-03-30 — End: 2014-03-30
  Administered 2014-03-30: 300 mg via ORAL
  Filled 2014-03-30 (×2): qty 1

## 2014-03-30 MED ORDER — ACETAMINOPHEN 325 MG PO TABS: 650.0000 mg | ORAL_TABLET | ORAL | Status: DC | PRN

## 2014-03-30 MED ORDER — DULOXETINE HCL 30 MG PO CPEP
30.0000 mg | ORAL_CAPSULE | Freq: Every day | ORAL | Status: DC
Start: 2014-03-30 — End: 2014-03-30
  Administered 2014-03-30: 30 mg via ORAL
  Filled 2014-03-30: qty 1

## 2014-03-30 MED ORDER — ONDANSETRON HCL 4 MG PO TABS
4.0000 mg | ORAL_TABLET | Freq: Three times a day (TID) | ORAL | Status: DC | PRN
Start: 2014-03-30 — End: 2014-04-06
  Filled 2014-03-30: qty 1

## 2014-03-30 MED ORDER — INDOMETHACIN 50 MG PO CAPS
50.0000 mg | ORAL_CAPSULE | Freq: Two times a day (BID) | ORAL | Status: DC
  Administered 2014-03-31 – 2014-04-06 (×13): 50 mg via ORAL
  Filled 2014-03-30: qty 2
  Filled 2014-03-30 (×15): qty 1

## 2014-03-30 MED ORDER — MAGNESIUM HYDROXIDE 400 MG/5ML PO SUSP: 30.0000 mL | Freq: Every day | ORAL | Status: DC | PRN

## 2014-03-30 MED ORDER — LITHIUM CARBONATE ER 300 MG PO TBCR
300.0000 mg | EXTENDED_RELEASE_TABLET | Freq: Two times a day (BID) | ORAL | Status: DC
  Administered 2014-03-30 – 2014-03-31 (×2): 300 mg via ORAL
  Filled 2014-03-30 (×8): qty 1

## 2014-03-30 MED ORDER — GABAPENTIN 300 MG PO CAPS
300.0000 mg | ORAL_CAPSULE | Freq: Three times a day (TID) | ORAL | Status: DC
  Administered 2014-03-31 – 2014-04-03 (×11): 300 mg via ORAL
  Filled 2014-03-30 (×14): qty 1

## 2014-03-30 MED ORDER — INDOMETHACIN 50 MG PO CAPS
50.0000 mg | ORAL_CAPSULE | Freq: Two times a day (BID) | ORAL | Status: DC
  Administered 2014-03-30: 50 mg via ORAL
  Filled 2014-03-30 (×2): qty 1

## 2014-03-30 MED ORDER — TRAZODONE HCL 100 MG PO TABS
100.0000 mg | ORAL_TABLET | Freq: Every day | ORAL | Status: DC
  Administered 2014-03-30 – 2014-04-02 (×4): 100 mg via ORAL
  Filled 2014-03-30 (×7): qty 1

## 2014-03-30 MED ORDER — TRAZODONE HCL 100 MG PO TABS: 100.0000 mg | ORAL_TABLET | Freq: Every day | ORAL | Status: DC

## 2014-03-30 MED ORDER — NICOTINE 21 MG/24HR TD PT24
21.0000 mg | MEDICATED_PATCH | Freq: Every day | TRANSDERMAL | Status: DC
  Filled 2014-03-30: qty 1

## 2014-03-30 MED ORDER — ALUM & MAG HYDROXIDE-SIMETH 200-200-20 MG/5ML PO SUSP: 30.0000 mL | ORAL | Status: DC | PRN

## 2014-03-30 MED ORDER — IBUPROFEN 800 MG PO TABS
800.0000 mg | ORAL_TABLET | Freq: Three times a day (TID) | ORAL | Status: DC | PRN
Start: 2014-03-30 — End: 2014-04-06
  Administered 2014-03-30 – 2014-04-06 (×2): 800 mg via ORAL
  Filled 2014-03-30 (×2): qty 1

## 2014-03-30 MED ORDER — OMEGA-3-ACID ETHYL ESTERS 1 G PO CAPS
1.0000 g | ORAL_CAPSULE | Freq: Every day | ORAL | Status: DC
  Administered 2014-03-31 – 2014-04-06 (×7): 1 g via ORAL
  Filled 2014-03-30 (×9): qty 1

## 2014-03-30 MED ORDER — OLANZAPINE 10 MG PO TBDP
10.0000 mg | ORAL_TABLET | Freq: Every day | ORAL | Status: DC
  Administered 2014-03-30: 10 mg via ORAL
  Filled 2014-03-30 (×3): qty 1

## 2014-03-30 MED ORDER — BENZTROPINE MESYLATE 1 MG PO TABS
0.5000 mg | ORAL_TABLET | Freq: Every day | ORAL | Status: DC
  Administered 2014-03-30: 0.5 mg via ORAL
  Filled 2014-03-30: qty 1

## 2014-03-30 MED ORDER — DULOXETINE HCL 30 MG PO CPEP
30.0000 mg | ORAL_CAPSULE | Freq: Every day | ORAL | Status: DC
  Administered 2014-03-31 – 2014-04-03 (×4): 30 mg via ORAL
  Filled 2014-03-30 (×6): qty 1

## 2014-03-30 MED ORDER — ACETAMINOPHEN 325 MG PO TABS: 650.0000 mg | ORAL_TABLET | Freq: Four times a day (QID) | ORAL | Status: DC | PRN

## 2014-03-30 MED ORDER — OLANZAPINE 10 MG PO TBDP
10.0000 mg | ORAL_TABLET | Freq: Every day | ORAL | Status: DC
Start: 2014-03-30 — End: 2014-03-30

## 2014-03-30 MED ORDER — NICOTINE POLACRILEX 2 MG MT GUM
2.0000 mg | CHEWING_GUM | OROMUCOSAL | Status: DC | PRN
  Administered 2014-03-30 – 2014-04-06 (×26): 2 mg via ORAL
  Filled 2014-03-30 (×10): qty 1

## 2014-03-30 NOTE — BH Assessment (Signed)
[facility] by Nanine MeansJamison Lord, NP and Dr. Jannifer FranklinAkintayo. The attending provider is Dr. Dub MikesLugo. Bed assignment is 304-1. Nursing report # [phone]. Support paperwork completed.
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
## 2014-03-31 ENCOUNTER — Encounter (HOSPITAL_COMMUNITY): Payer: Self-pay

## 2014-03-31 DIAGNOSIS — F3130 Bipolar disorder, current episode depressed, mild or moderate severity, unspecified: Secondary | ICD-10-CM

## 2014-03-31 DIAGNOSIS — R45851 Suicidal ideations: Secondary | ICD-10-CM

## 2014-03-31 DIAGNOSIS — F419 Anxiety disorder, unspecified: Secondary | ICD-10-CM

## 2014-03-31 MED ORDER — QUETIAPINE FUMARATE 100 MG PO TABS
100.0000 mg | ORAL_TABLET | Freq: Every day | ORAL | Status: DC
  Administered 2014-03-31 – 2014-04-01 (×2): 100 mg via ORAL
  Filled 2014-03-31 (×4): qty 1

## 2014-03-31 MED ORDER — OXYCODONE HCL 5 MG PO TABS
5.0000 mg | ORAL_TABLET | Freq: Four times a day (QID) | ORAL | Status: DC | PRN
Start: 2014-03-31 — End: 2014-04-06
  Administered 2014-03-31 – 2014-04-06 (×21): 5 mg via ORAL
  Filled 2014-03-31 (×21): qty 1

## 2014-03-31 MED ORDER — DIVALPROEX SODIUM ER 500 MG PO TB24
1000.0000 mg | ORAL_TABLET | Freq: Every day | ORAL | Status: DC
  Administered 2014-03-31 – 2014-04-05 (×6): 1000 mg via ORAL
  Filled 2014-03-31 (×7): qty 2

## 2014-03-31 NOTE — Tx Team (Signed)
Initial Interdisciplinary Treatment Plan   PATIENT STRESSORS: Medication change or noncompliance " I don't think my meds are working"  PATIENT STRENGTHS: Ability for insight Average or above average intelligence Capable of independent living Astronomerinancial means General fund of knowledge Supportive family/friends   PROBLEM LIST: Problem List/Patient Goals Date to be addressed Date deferred Reason deferred Estimated date of resolution  Increased risk for SI 11/12     Depression 11/12     SI 11/12     Anxiety 11/12                                    DISCHARGE CRITERIA:  Improved stabilization in mood, thinking, and/or behavior Need for constant or close observation no longer present Reduction of life-threatening or endangering symptoms to within safe limits Verbal commitment to aftercare and medication compliance  PRELIMINARY DISCHARGE PLAN: Attend PHP/IOP Return to previous living arrangement  PATIENT/FAMIILY INVOLVEMENT: This treatment plan has been presented to and reviewed with the patient, Jearld ShinesRichard C Kautzman.  The patient and family have been given the opportunity to ask questions and make suggestions.  Chaya Dehaan A 03/31/2014, 4:47 AM

## 2014-03-31 NOTE — Progress Notes (Addendum)
Pt is a 33 yr old married male vol admitted for SI and depression. Pt denies having a specific plan. Pt reports suicide attempts in the past by overdosing. [facility]. Pt reports that his medication regimen is not working. Pt did not take his Indocin and Neurontin [facility]. Pt denies any current or past drug use or abuse. Pt reports a PMH of Bipolar disorder, Depression, Anxiety, and chronic knee and hip pain. Pt's goal is to gain control over his depression. Pt also states " I want to attend groups and talk to the Doctor more". Pt orientated to the unit's polices and procedures. Pt denies any SI/HI/AVH. Pt verbally contracts for safety.   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

## 2014-03-31 NOTE — Progress Notes (Signed)
Patient ID: Donald ShinesRichard C Sullivan, male   DOB: December 02, 1980, 33 y.o.   MRN: [id number]  D: Pt currently presents with a flat affect and anxious behavior. Per self inventory, pt rates depression at a 9 and hopelessness 9. Pt's daily goal is to "make groups" and they intend to do so by "stay out of room." Pt reports poor sleep last night despite taking a bedtime medication. Pt reports poor concentration and a fair appetite.  Pt reports that he wishes to take Oxycontin 5mg , that he was taking pta, for chronic hip and knee pain.    A: Pt provided with medications per providers orders. Pt's labs and vitals were monitored throughout the day. Pt supported emotionally and encouraged to express concerns and questions. Pt consulted with provider and nurse about his medications. Pt educated on side effects of medication and anxiety relieve methods. Pt shaved today.    R: Pt's safety ensured with 15 minute and environmental checks. Pt currently denies SI/HI and A/V hallucinations. Pt verbally agrees to seek staff if SI/HI or A/VH occurs and to consult with staff before acting on these thoughts. Pt attended and participated in groups. Pt has taken an active role in his treatment. Pt currently interacting in day room, playing cards with other patients.    Aurora Maskwyman, Jhada Risk E, RN
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
## 2014-03-31 NOTE — Progress Notes (Signed)
D: Pt presents flat in affect and depressed in mood. Pt up and within the milieu. Pt is currently in group. Pt questioning writer bout his next available dose of pain medication during the onset of group. Pt informed that Clinical research associatewriter will update him after group. Pt in agreement with follow-up. Pt did receive a dose of Roxi after group. Pt has been compliant with his current plan of care.  A: Writer administered scheduled and prn medications to pt. Continued support and availability as needed was extended to this pt. Staff continue to monitor pt with q215min checks.  R: No adverse drug reactions noted. Pt receptive to treatment. Pt remains safe at this time.

## 2014-03-31 NOTE — [facility] Group Notes (Signed)
[facility]   03/31/2014 9:32 AM    Participation Quality:  Appropraite  Mood/Affect:  Appropriate  Depression Rating:  7  Anxiety Rating:  5  Thoughts of Suicide:  No  Will you contract for safety?   NA  Current AVH:  No  Plan for Discharge/Comments:  Patient attended discharge planning group and actively participated in group. Patient advised of readmitting to the hospital due to medications not working as needed.  He advised of having outpatient services with Dr. Jannifer FranklinAkintayo.  Patient has home and transportation at discharge. Suicide prevention education reviewed and SPE document provided.   Transportation Means: Patient has transportation.   Supports:  Patient has a support system.   Sophronia Varney, Joesph JulyQuylle Hairston

## 2014-03-31 NOTE — BHH Group Notes (Signed)
[facility] LCSW Group Therapy  Feelings Around Relapse 1:15 -2:30        03/31/2014 2:53 PM   Type of Therapy:  Group Therapy  Participation Level:  Appropriate  Participation Quality:  Non  Affect: Depressed, Flat  Cognitive:  Appropriate  Insight:  Developing/Improving  Engagement in Therapy: Developing/Improving  Modes of Intervention:  Discussion Exploration Problem-Solving Supportive  Summary of Progress/Problems:  The topic for today was feelings around relapse.    Patient attended group but did not engage in discussion.  Wynn BankerHodnett, [name] 03/31/2014  2:53 PM

## 2014-03-31 NOTE — BHH Suicide Risk Assessment (Signed)
   Nursing information obtained from:    Demographic factors:   33 year old  Man  Current Mental Status:   See below Loss Factors:   On Disability, Anniversary of family deaths, chronic pain issues Historical Factors:   History of Depression and recent hospitalizations Risk Reduction Factors:   Resilience  Total Time spent with patient: 45 minutes  CLINICAL FACTORS:  Depression, suicidal ideations  Psychiatric Specialty Exam: Physical Exam  ROS  Blood pressure 128/82, pulse 97, temperature 97.9 F (36.6 C), temperature source Oral, resp. rate 18, height 5\' 7"  (1.702 m), weight 67.359 kg (148 lb 8 oz).Body mass index is 23.25 kg/(m^2).  General Appearance: Fairly Groomed  Patent attorneyye Contact::  Good  Speech:  Normal Rate  Volume:  Normal  Mood:  Anxious and Depressed  Affect:  Constricted and anxious  Thought Process:  Goal Directed and Linear  Orientation:  Full (Time, Place, and Person)  Thought Content:  denies hallucinations, no delusions  Suicidal Thoughts:  Yes.  without intent/plan- at this time denies any suicidal plan or intention and contracts for safety on the unit  Homicidal Thoughts:  No  Memory: Recent and Remote grossly intact  Judgement:  Fair  Insight:  Fair  Psychomotor Activity:  Slightly restless   Concentration:  Good  Recall:  Good  Fund of Knowledge:Good  Language: Good  Akathisia:  Negative  Handed:  Right  AIMS (if indicated):     Assets:  Desire for Improvement Resilience  Sleep:  Number of Hours: 6.5   Musculoskeletal: Strength & Muscle Tone: within normal limits Gait & Station: normal Patient leans: N/A  COGNITIVE FEATURES THAT CONTRIBUTE TO RISK:  Closed-mindedness    SUICIDE RISK:   Moderate:  Frequent suicidal ideation with limited intensity, and duration, some specificity in terms of plans, no associated intent, good self-control, limited dysphoria/symptomatology, some risk factors present, and identifiable protective factors, including  available and accessible social support.  PLAN OF CARE: Patient will be admitted to inpatient psychiatric unit for stabilization and safety. Will provide and encourage milieu participation. Provide medication management and maked adjustments as needed.  Will follow daily.    I certify that inpatient services furnished can reasonably be expected to improve the patient's condition.  [name] 03/31/2014, 2:35 PM

## 2014-03-31 NOTE — BHH Counselor (Signed)
Adult Comprehensive Assessment   Patient ID: Donald ShinesRichard C Sullivan, male DOB: November 24, 1980, 33 y.o. MRN: [id number]   Information Source:  Information source: Patient   Current Stressors:   Educational / Learning stressors: None  Employment / Job issues: Patient is disabled  Family Relationships: None  Surveyor, quantityinancial / Lack of resources (include bankruptcy): Gets by okay Housing / Lack of housing: None  Physical health (include injuries & life threatening diseases): None  Social relationships: None  Substance abuse: None  Bereavement / Loss: Patient reports mother, grandparents, and two inlaw grandparents have died over the past year   Living/Environment/Situation:   Living Arrangements: Spouse/significant other;Children  Living conditions (as described by patient or guardian): Good  How long has patient lived in current situation?: Five months  What is atmosphere in current home: Comfortable;Loving;Supportive   Family History:   Marital status: Married  Number of Years Married: 3  What types of issues is patient dealing with in the relationship?: No problems in the marriage  Does patient have children?: Yes  How many children?: 2  How is patient's relationship with their children?: Good relationship with young children  Childhood History:  By whom was/is the patient raised?: Mother  Additional childhood history information: Good childhood  Description of patient's relationship with caregiver when they were a child: Very good  Patient's description of current relationship with people who raised him/her: Mother is deceased  Does patient have siblings?: Yes  Number of Siblings: 2  Description of patient's current relationship with siblings: Distant relationship with sister  Did patient suffer any verbal/emotional/physical/sexual abuse as a child?: No  Did patient suffer from severe childhood neglect?: No  Has patient ever been sexually  abused/assaulted/raped as an adolescent or adult?: No  Was the patient ever a victim of a crime or a disaster?: No  Witnessed domestic violence?: No  Has patient been effected by domestic violence as an adult?: No   Education:   Highest grade of school patient has completed: Tenth grade  Currently a student?: No  Learning disability?: Yes (ADD)   Employment/Work Situation:   Employment situation: On disability  Why is patient on disability: Bipolar  How long has patient been on disability: Three years  Patient's job has been impacted by current illness: No  What is the longest time patient has a held a job?: One year  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  Has patient ever been in the [employer] and Companymilitary?: No  Has patient ever served in Buyer, retailcombat?: No   Financial Resources:   Surveyor, quantityinancial resources: Insurance claims handlereceives SSDI  Does patient have a Lawyerrepresentative payee or guardian?: No   Alcohol/Substance Abuse:   What has been your use of drugs/alcohol within the last 12 months?: None  If attempted suicide, did drugs/alcohol play a role in this?: No  Alcohol/Substance Abuse Treatment Hx: Denies past history  Has alcohol/substance abuse ever caused legal problems?: No   Social Support System:   Forensic psychologistatient's Community Support System: None  Describe Community Support System: N/A  Type of faith/religion: None  How does patient's faith help to cope with current illness?: N/A   Leisure/Recreation:  Leisure and Hobbies: Family man   Strengths/Needs:   What things does the patient do well?: Taking care of wife and children  In what areas does patient struggle / problems for patient: Depression   Discharge Plan:   Does patient have access to transportation?: Yes  Will patient be returning to same living situation after discharge?: Yes  Currently receiving community mental health services: Yes (From Whom) ([facility] [place]  If no, would  patient like referral for services when discharged?: No  Does patient have financial barriers related to discharge medications?: No   Summary/Recommendations:   Donald ShinesRichard C Sullivan is a 33 y.o. male with admitted to the hospital with SI and Major Depression Disorder.  Patient recently discharged from the hospital but advised medications are not working properly.  He will benefit from crisis stabilization, evaluation for medication, psycho-education groups for coping skills development, group therapy and case management for discharge planning.

## 2014-03-31 NOTE — Tx Team (Signed)
Interdisciplinary Treatment Plan Update   Date Reviewed:  03/31/2014  Time Reviewed:  8:42 AM  Progress in Treatment:   Attending groups: Yes Participating in groups: Yes Taking medication as prescribed: Yes  Tolerating medication: Yes Family/Significant other contact made:  No, but will ask patient for consent for collateral contact Patient understands diagnosis: Yes, patient understands diagnosis and need for treatment. Discussing patient identified problems/goals with staff: Yes, patient is able to express goals for treatment and discharge. Medical problems stabilized or resolved: Yes Denies suicidal/homicidal ideation: Yes Patient has not harmed self or others: Yes  For review of initial/current patient goals, please see plan of care.  Estimated Length of Stay:  3-5 days  Reasons for Continued Hospitalization:  Anxiety Depression Medication stabilization   New Problems/Goals identified:    Discharge Plan or Barriers:   Home with outpatient follow up with Dr. Jannifer FranklinAkintayo  Additional Comments:  [name] is a 33 y.o. male who [facility]ression. Pt repots that he been SI x2 days with no triggers for this episode and no plan to harm self. Pt says he is unable to contract for safety--"there's too much I can do at home". Pt admits multiple SI attempts in the past all by overdose. Pt.'s [facility] and stated that he needs help with alcohol, however pt says he doesn't have a problem with alcohol. He consumes 4-5 12oz beers several times a week, his last drink was 03/29/14, he drank 4-12oz beers.   Patient and CSW reviewed patient's identified goals and treatment plan.  Patient verbalized understanding and agreed to treatment plan.   Attendees:  Patient:  03/31/2014 8:42 AM   Signature:  Sallyanne HaversF. Cobos, MD 03/31/2014 8:42 AM  Signature: 03/31/2014 8:42 AM  Signature:  Earl ManySara Twyman, RN 03/31/2014 8:42 AM  Signature:Beverly  Terrilee CroakKnight, RN 03/31/2014 8:42 AM  Signature:   03/31/2014 8:42 AM  Signature:  Juline PatchQuylle Lavene Penagos, LCSW 03/31/2014 8:42 AM  Signature:  Belenda CruiseKristin Drinkard, LCSW-A 03/31/2014 8:42 AM  Signature:  Leisa LenzValerie Enoch, Care Coordinator [facility] 03/31/2014 8:42 AM  Signature:   03/31/2014 8:42 AM  Signature:  03/31/2014  8:42 AM  Signature:   Onnie BoerJennifer Clark, RN [facility] 03/31/2014  8:42 AM  Signature:  Santa GeneraAnne Cunningham Lead Social Worker LCSW 03/31/2014  8:42 AM    Scribe for Treatment Team:   Juline PatchQuylle Noble Bodie,  03/31/2014 8:42 AM

## 2014-03-31 NOTE — [facility] Group Notes (Signed)
Adult Psychoeducational Group Note  Date:  03/31/2014 Time:  10:11 PM  Group Topic/Focus:  AA Meeting  Participation Level:  Minimal  Participation Quality:  Attentive  Affect:  Flat  Cognitive:  Appropriate  Insight: Limited  Engagement in Group:  Limited  Modes of Intervention:  Discussion and Education  Additional Comments:  Jamorris attended group.  Caroll RancherLindsay, Saleah Rishel A 03/31/2014, 10:11 PM

## 2014-03-31 NOTE — [facility] Suicide Risk Assessment (Signed)
[facility] INPATIENT:  Family/Significant Other Suicide Prevention Education  Suicide Prevention Education:  Patient Refusal for Family/Significant Other Suicide Prevention Education: The patient [name] has refused to provide written consent for family/significant other to be provided Family/Significant Other Suicide Prevention Education during admission and/or prior to discharge.  Physician notified.  Torian Quintero Hairston 03/31/2014, 11:00 AM

## 2014-04-01 ENCOUNTER — Encounter (HOSPITAL_COMMUNITY): Payer: Self-pay | Admitting: Physician Assistant

## 2014-04-01 NOTE — Progress Notes (Signed)
[facility] Progress Note  04/01/2014 11:33 AM Donald ShinesRichard C Sullivan  MRN:  [id number] Subjective:  "Im about the same as when I came in." Objective: Patient is seen and is up and active in the unit milieu. He reports no new symptoms. He is attending groups. States his medications have been changed and that takes getting used to.Marland Kitchen.Marland Kitchen.States his sleep is "so-so" and his appetite is fair.  Diagnosis:  MDD-severe recurrent DSM5: Schizophrenia Disorders:   Obsessive-Compulsive Disorders:   Trauma-Stressor Disorders:   Substance/Addictive Disorders:   Depressive Disorders:  Major Depressive Disorder - Severe (296.23) Total Time spent with patient: 15 minutes    ADL's:  Intact  Sleep: Fair  Appetite:  Fair  Suicidal Ideation:  Denies thoughts, intent plans or means Homicidal Ideation:  denies AEB (as evidenced by):  Psychiatric Specialty Exam: Physical Exam  ROS  Blood pressure 127/83, pulse 89, temperature 97.7 F (36.5 C), temperature source Oral, resp. rate 18, height 5\' 7"  (1.702 m), weight 148 lb 8 oz (67.359 kg).Body mass index is 23.25 kg/(m^2).  General Appearance: Fairly Groomed  Patent attorneyye Contact::  Good  Speech:  Clear and Coherent  Volume:  Normal  Mood:  Anxious and Depressed  Affect:  Congruent  Thought Process:  Goal Directed  Orientation:  Full (Time, Place, and Person)  Thought Content:  WDL  Suicidal Thoughts:  No  Homicidal Thoughts:  No  Memory:  NA  Judgement:  Fair  Insight:  Present  Psychomotor Activity:  Normal  Concentration:  Fair  Recall:  Fair  Fund of Knowledge:Good  Language: Good  Akathisia:  No  Handed:  Right  AIMS (if indicated):     Assets:  Communication Skills Desire for Improvement  Sleep:  Number of Hours: 6.25   Musculoskeletal: Strength & Muscle Tone: within normal limits Gait & Station: normal Patient leans: N/A  Current Medications: Current Facility-Administered Medications  Medication Dose Route Frequency Provider Last Rate Last  Dose  . alum & mag hydroxide-simeth (MAALOX/MYLANTA) 200-200-20 MG/5ML suspension 30 mL  30 mL Oral Q4H PRN Nanine MeansJamison Lord, NP      . divalproex (DEPAKOTE ER) 24 hr tablet 1,000 mg  1,000 mg Oral QHS Fransisca KaufmannLaura Davis, NP   1,000 mg at 03/31/14 2137  . DULoxetine (CYMBALTA) DR capsule 30 mg  30 mg Oral Daily Nanine MeansJamison Lord, NP   30 mg at 04/01/14 0744  . gabapentin (NEURONTIN) capsule 300 mg  300 mg Oral TID Nanine MeansJamison Lord, NP   300 mg at 04/01/14 0744  . ibuprofen (ADVIL,MOTRIN) tablet 800 mg  800 mg Oral TID PRN Nanine MeansJamison Lord, NP   800 mg at 03/30/14 2207  . indomethacin (INDOCIN) capsule 50 mg  50 mg Oral BID WC Nanine MeansJamison Lord, NP   50 mg at 04/01/14 0743  . magnesium hydroxide (MILK OF MAGNESIA) suspension 30 mL  30 mL Oral Daily PRN Nanine MeansJamison Lord, NP      . nicotine polacrilex (NICORETTE) gum 2 mg  2 mg Oral PRN Craige CottaFernando A Cobos, MD   2 mg at 04/01/14 0745  . omega-3 acid ethyl esters (LOVAZA) capsule 1 g  1 g Oral Daily Nanine MeansJamison Lord, NP   1 g at 04/01/14 0743  . ondansetron (ZOFRAN) tablet 4 mg  4 mg Oral Q8H PRN Nanine MeansJamison Lord, NP      . oxyCODONE (Oxy IR/ROXICODONE) immediate release tablet 5 mg  5 mg Oral Q6H PRN Fransisca KaufmannLaura Davis, NP   5 mg at 04/01/14 0620  . QUEtiapine (SEROQUEL) tablet 100  mg  100 mg Oral QHS Fransisca KaufmannLaura Davis, NP   100 mg at 03/31/14 2137  . traZODone (DESYREL) tablet 100 mg  100 mg Oral QHS Nanine MeansJamison Lord, NP   100 mg at 03/31/14 2137    Lab Results: No results found for this or any previous visit (from the past 48 hour(s)).  Physical Findings: AIMS: Facial and Oral Movements Muscles of Facial Expression: None, normal Lips and Perioral Area: None, normal Jaw: None, normal Tongue: None, normal,Extremity Movements Upper (arms, wrists, hands, fingers): None, normal Lower (legs, knees, ankles, toes): None, normal, Trunk Movements Neck, shoulders, hips: None, normal, Overall Severity Severity of abnormal movements (highest score from questions above): None, normal Incapacitation due to  abnormal movements: None, normal Patient's awareness of abnormal movements (rate only patient's report): No Awareness, Dental Status Current problems with teeth and/or dentures?: No Does patient usually wear dentures?: No  CIWA:    COWS:     Treatment Plan Summary: Daily contact with patient to assess and evaluate symptoms and progress in treatment Medication management  Plan: 1. Continue crisis management and stabilization. 2. Continue medication management by assessing side effects, dose modification as needed and therapeutic blood levels as indicated. 3. Treat health problems as indicated or consult IM as needed. 4. Continue treatment plan to decrease risk of relapse upon discharge and to reduce the need for readmission to incorporate the use of local resources for support. 5. Psycho-social education regarding relapse prevention through good self care to incorporate diet, exercise, stress reduction, good sleep hygiene, and reduction in external risk factors such as alcohol, tobacco, and substance abuse. 6. Gain consent for contact with family, PCP, or outside health provider as needed. 7. Review home medications as needed.    Medical Decision Making Problem Points:  Established problem, stable/improving (1) Data Points:  Review or order clinical lab tests (1) Review of medication regiment & side effects (2)  I certify that inpatient services furnished can reasonably be expected to improve the patient's condition.  Rona RavensNeil T. Lashaunta Sicard RPAC 2:32 PM 04/01/2014

## 2014-04-01 NOTE — [facility] Group Notes (Signed)
[facility] Group Notes:  Self inventory  Date:  04/01/2014  Time:  9:32 AM  Type of Therapy:  Nurse Education  Participation Level:  Active  Participation Quality:  Appropriate  Affect:  Appropriate  Cognitive:  Appropriate  Insight:  Appropriate  Engagement in Group:  Engaged  Modes of Intervention:  Education  Summary of Progress/Problems:  Nicole CellaWebb, Maryella Abood Guyes 04/01/2014, 9:32 AM

## 2014-04-01 NOTE — Plan of Care (Signed)
Problem: Ineffective individual coping Goal: STG: Patient will remain free from self harm Outcome: Progressing Safety maintained on Q 15 minutes checks without gestures or event of self harm to note at present.

## 2014-04-01 NOTE — Progress Notes (Signed)
Patient ID: Donald ShinesRichard C Sullivan, male   DOB: April 11, 1981, 33 y.o.   MRN: [id number] D: Patient denies SI/HI and auditory and visual hallucinations. Patient states he has a low energy level and that he did not sleep well last pm.  He reports he continues to be depressed and feels hopeless reporting levels of 7 on a scale of 1 to 10. Patients has been in dayroom when not attending groups.  A: Patient given emotional support from RN. Patient given medications per MD orders. Patient encouraged to attend groups and unit activities. Patient encouraged to come to staff with any questions or concerns.  R: Patient remains cooperative and appropriate. Will continue to monitor patient for safety.

## 2014-04-01 NOTE — [facility] Group Notes (Signed)
[facility] LCSW Group Therapy  04/01/2014 1:07 PM  Type of Therapy:  Group Therapy  Participation Level:  Active  Participation Quality:  Sharing and Supportive  Affect:  Flat  Cognitive:  Appropriate  Insight:  Developing/Improving, Engaged and Supportive  Engagement in Therapy:  Developing/Improving, Engaged and Supportive  Modes of Intervention:  Discussion, Education, Exploration, Rapport Building and Support  Summary of Progress/Problems: Pt was able to speak about support networks during group, however pt did not share for long.  Pt expressed understanding of support networks, positive/negative supports, as well as professional and personal supports.   Seabron SpatesVaughn, Elizebeth Kluesner Anne 04/01/2014, 1:07 PM

## 2014-04-01 NOTE — Plan of Care (Signed)
Problem: Ineffective individual coping Goal: STG:Pt. will utilize relaxation techniques to reduce stress STG: Patient will utilize relaxation techniques to reduce stress levels  Outcome: Progressing Pt. observed interacting well with peers while playing cards in dayroom, affect bright, appears relaxed without physical distress to note at this time.

## 2014-04-01 NOTE — [facility] Group Notes (Signed)
[facility] Notes:Healthy Coping skills  Date:  04/01/2014  Time:  2:10 PM  Type of Therapy:  Nurse Education  Participation Level:  Active  Participation Quality:  Appropriate  Affect:  Appropriate  Cognitive:  Appropriate  Insight:  Appropriate  Engagement in Group:  Engaged  Modes of Intervention:  Education  Summary of Progress/Problems:Pt stated his children and his pit bull help him get through his difficult days  Nicole CellaWebb, Finnleigh Marchetti Guyes 04/01/2014, 2:10 PM

## 2014-04-02 ENCOUNTER — Encounter (HOSPITAL_COMMUNITY): Payer: Self-pay | Admitting: Physician Assistant

## 2014-04-02 DIAGNOSIS — F332 Major depressive disorder, recurrent severe without psychotic features: Secondary | ICD-10-CM | POA: Insufficient documentation

## 2014-04-02 MED ORDER — QUETIAPINE FUMARATE 200 MG PO TABS
200.0000 mg | ORAL_TABLET | Freq: Every day | ORAL | Status: DC
  Administered 2014-04-02: 200 mg via ORAL
  Filled 2014-04-02 (×3): qty 1

## 2014-04-02 NOTE — Plan of Care (Signed)
Problem: Diagnosis: Increased Risk For Suicide Attempt Goal: STG-Patient Will Report Suicidal Feelings to Staff Outcome: Progressing Patient denies suicidal ideations     

## 2014-04-02 NOTE — Progress Notes (Signed)
Patient ID: Jearld ShinesRichard C Furry, male   DOB: Jun 19, 1980, 33 y.o.   MRN: [id number] He has been up and interacting with peers and staff, has  Requested and received prn for c/o knee,s hurting.  Medication was effective.  Self inventory:  Depression 7, hopelessness 6. Anxiety 2, denies withdrawals, and denies SI thoughts. Goal;  To go to groups and get medications straight.  He has been calm and cooperative today no agitation noted, has not been demanding.

## 2014-04-02 NOTE — Progress Notes (Signed)
[facility]  04/02/2014 11:36 AM [name]  MRN:  [id number] Subjective: Patient is up and active in the unit milieu. He reports he did not sleep well last night and feels that his seroquel needs to be adjusted. He reports being on 800mg  at hs prior to admission. He says his appetite is so so, and rates his depression as a 7/10 and feels that his meds have not "kicked in yet."  He notes his anxiety is 3-4/10.    Objective: Patient is up and reports attending groups. Denies SI/HI or AVH.  Chart indicates he does attend groups and has been up laughing and interacting appropriately with his peers. His behavior on the unit has been appropriate.  Diagnosis:  MDD-severe recurrent DSM5: Schizophrenia Disorders:   Obsessive-Compulsive Disorders:   Trauma-Stressor Disorders:   Substance/Addictive Disorders:   Depressive Disorders:  Major Depressive Disorder - Severe (296.23) Total Time spent with patient: 15 minutes    ADL's:  Intact  Sleep: Fair  Appetite:  Fair  Suicidal Ideation:  Denies thoughts, intent plans or means Homicidal Ideation:  denies AEB (as evidenced by):  Psychiatric Specialty Exam: Physical Exam  ROS  Blood pressure 137/81, pulse 82, temperature 98.1 F (36.7 C), temperature source Oral, resp. rate 18, height 5\' 7"  (1.702 m), weight 67.359 kg (148 lb 8 oz).Body mass index is 23.25 kg/(m^2).  General Appearance: Fairly Groomed  Patent attorneyye Contact::  Good  Speech:  Clear and Coherent  Volume:  Normal  Mood:  Anxious and Depressed  Affect:  Congruent  Thought Process:  Goal Directed  Orientation:  Full (Time, Place, and Person)  Thought Content:  WDL  Suicidal Thoughts:  No  Homicidal Thoughts:  No  Memory:  NA  Judgement:  Fair  Insight:  Present  Psychomotor Activity:  Normal  Concentration:  Fair  Recall:  Fair  Fund of Knowledge:Good  Language: Good  Akathisia:  No  Handed:  Right  AIMS (if indicated):     Assets:  Communication  Skills Desire for Improvement  Sleep:  Number of Hours: 5.75   Musculoskeletal: Strength & Muscle Tone: within normal limits Gait & Station: normal Patient leans: N/A  Current Medications: Current Facility-Administered Medications  Medication Dose Route Frequency Provider Last Rate Last Dose  . alum & mag hydroxide-simeth (MAALOX/MYLANTA) 200-200-20 MG/5ML suspension 30 mL  30 mL Oral Q4H PRN Nanine MeansJamison Lord, NP      . divalproex (DEPAKOTE ER) 24 hr tablet 1,000 mg  1,000 mg Oral QHS Fransisca KaufmannLaura Davis, NP   1,000 mg at 04/01/14 2114  . DULoxetine (CYMBALTA) DR capsule 30 mg  30 mg Oral Daily Nanine MeansJamison Lord, NP   30 mg at 04/02/14 0830  . gabapentin (NEURONTIN) capsule 300 mg  300 mg Oral TID Nanine MeansJamison Lord, NP   300 mg at 04/02/14 1134  . ibuprofen (ADVIL,MOTRIN) tablet 800 mg  800 mg Oral TID PRN Nanine MeansJamison Lord, NP   800 mg at 03/30/14 2207  . indomethacin (INDOCIN) capsule 50 mg  50 mg Oral BID WC Nanine MeansJamison Lord, NP   50 mg at 04/02/14 0830  . magnesium hydroxide (MILK OF MAGNESIA) suspension 30 mL  30 mL Oral Daily PRN Nanine MeansJamison Lord, NP      . nicotine polacrilex (NICORETTE) gum 2 mg  2 mg Oral PRN Craige CottaFernando A Cobos, MD   2 mg at 04/02/14 1057  . omega-3 acid ethyl esters (LOVAZA) capsule 1 g  1 g Oral Daily Nanine MeansJamison Lord, NP   1  g at 04/02/14 0829  . ondansetron (ZOFRAN) tablet 4 mg  4 mg Oral Q8H PRN Nanine MeansJamison Lord, NP      . oxyCODONE (Oxy IR/ROXICODONE) immediate release tablet 5 mg  5 mg Oral Q6H PRN Fransisca KaufmannLaura Davis, NP   5 mg at 04/02/14 1056  . QUEtiapine (SEROQUEL) tablet 100 mg  100 mg Oral QHS Fransisca KaufmannLaura Davis, NP   100 mg at 04/01/14 2114  . traZODone (DESYREL) tablet 100 mg  100 mg Oral QHS Nanine MeansJamison Lord, NP   100 mg at 04/01/14 2114    Lab Results: No results found for this or any previous visit (from the past 48 hour(s)).  Physical Findings: AIMS: Facial and Oral Movements Muscles of Facial Expression: None, normal Lips and Perioral Area: None, normal Jaw: None, normal Tongue: None,  normal,Extremity Movements Upper (arms, wrists, hands, fingers): None, normal Lower (legs, knees, ankles, toes): None, normal, Trunk Movements Neck, shoulders, hips: None, normal, Overall Severity Severity of abnormal movements (highest score from questions above): None, normal Incapacitation due to abnormal movements: None, normal Patient's awareness of abnormal movements (rate only patient's report): No Awareness, Dental Status Current problems with teeth and/or dentures?: No Does patient usually wear dentures?: No  CIWA:    COWS:     Treatment Plan Summary: Daily contact with patient to assess and evaluate symptoms and progress in treatment Medication management  Plan: 1. Continue medication management and stabilization. 2. Will increase Seroquel for sleep to 200mg  at hs. And follow.  Medical Decision Making Problem Points:  Established problem, stable/improving (1) Data Points:  Review or order clinical lab tests (1) Review of medication regiment & side effects (2)  I certify that inpatient services furnished can reasonably be expected to improve the patient's condition.  Rona RavensNeil T. Rahel Carlton RPAC 11:36 AM 04/02/2014

## 2014-04-02 NOTE — Progress Notes (Signed)
Pt attended the AA speaker meeting tonight. Pt was alert and engaged. Affect was appropriate.

## 2014-04-02 NOTE — Plan of Care (Signed)
Problem: Diagnosis: Increased Risk For Suicide Attempt Goal: STG-Patient Will Comply With Medication Regime Outcome: Progressing Patient is compliant with his medications.

## 2014-04-02 NOTE — Progress Notes (Signed)
Patient has been observed by Clinical research associatewriter in the dayroom playing cards and laughing and talking with peers. Patient reports that he is here because his medications are not working any longer. Patient voiced no complaints, attended group this evening and is compliant with his medications. Support and encouragement given, safety maintained on unit with 15 min checks.

## 2014-04-02 NOTE — Progress Notes (Signed)
Patient has been up and active on the unit, attended group this evening and has voiced no complaints. Patient currently denies having pain, -si/hi/a/v hall. Support and encouragement offered, safety maintained on unit, will continue to monitor.  

## 2014-04-02 NOTE — [facility] Group Notes (Signed)
[facility] LCSW Group Therapy  04/02/2014   10:00 AM   Type of Therapy:  Group Therapy  Participation Level:  Active  Participation Quality:  Appropriate and Attentive  Affect:  Appropriate, Depressed  Cognitive:  Alert and Appropriate  Insight:  Developing/Improving and Engaged  Engagement in Therapy:  Developing/Improving and Engaged  Modes of Intervention:  Clarification, Confrontation, Discussion, Education, Exploration, Limit-setting, Orientation, Problem-solving, Rapport Building, Dance movement psychotherapisteality Testing, Socialization and Support  Summary of Progress/Problems: Today's group topic was avoiding self sabotage and enabling behaviors. Group members were asked to define self sabotage and enabling and provide examples. Group members were then asked to discuss unhealthy relationships and how to have positive healthy boundaries with those that enable. Group members were asked to process how communicating needs and establishing a plan to change the above identified behavior. Pt was an active participant in today's group discussion.  Pt discussed dealing with depression and his self sabotaging behavior of stopping his meds when he feels better.  Pt was able to process how this has always been detrimental to his recovery and falls back into depression.  Pt states that he plans to allow his wife to help him with his meds, to hold him accountable.  Pt states that his wife and her family are positive supports and discussed how he had to cut off his own family due to their negativity.    Reyes IvanChelsea Horton, LCSW 04/02/2014 10:53 AM

## 2014-04-02 NOTE — Progress Notes (Signed)
Adult Psychoeducational Group Note  Date:  04/01/2014 Time:  11:49 PM  Group Topic/Focus:  Wrap-Up Group:   The focus of this group is to help patients review their daily goal of treatment and discuss progress on daily workbooks.  Participation Level:  Active  Participation Quality:  Appropriate, Attentive and Sharing  Affect:  Appropriate  Cognitive:  Appropriate  Insight: Appropriate  Engagement in Group:  Engaged  Modes of Intervention:  Support  Additional Comments:  Pt attended group and stated his goal for today was to get a print out of all of his medications. Pt stated he did not accomplish this goal because he was joking around with his peers and watching television.  Caswell CorwinOwen, Rodert Hinch C 04/01/2014, 11:49 PM

## 2014-04-03 DIAGNOSIS — F332 Major depressive disorder, recurrent severe without psychotic features: Secondary | ICD-10-CM

## 2014-04-03 MED ORDER — TRAZODONE HCL 50 MG PO TABS
75.0000 mg | ORAL_TABLET | Freq: Every day | ORAL | Status: DC
  Administered 2014-04-03: 75 mg via ORAL
  Administered 2014-04-04: 50 mg via ORAL
  Administered 2014-04-05: 75 mg via ORAL
  Filled 2014-04-03 (×4): qty 1.5

## 2014-04-03 MED ORDER — QUETIAPINE FUMARATE 100 MG PO TABS
250.0000 mg | ORAL_TABLET | Freq: Every day | ORAL | Status: DC
  Administered 2014-04-03 – 2014-04-05 (×3): 250 mg via ORAL
  Filled 2014-04-03 (×4): qty 2.5

## 2014-04-03 MED ORDER — DULOXETINE HCL 60 MG PO CPEP
60.0000 mg | ORAL_CAPSULE | Freq: Every day | ORAL | Status: DC
  Administered 2014-04-04 – 2014-04-06 (×3): 60 mg via ORAL
  Filled 2014-04-03 (×4): qty 1

## 2014-04-03 MED ORDER — GABAPENTIN 300 MG PO CAPS
300.0000 mg | ORAL_CAPSULE | Freq: Two times a day (BID) | ORAL | Status: DC
  Administered 2014-04-04: 300 mg via ORAL
  Filled 2014-04-03 (×5): qty 1

## 2014-04-03 NOTE — [facility] Group Notes (Signed)
[facility] LCSW Group Therapy          Overcoming Obstacles       1:15 -2:30        04/03/2014   3:12 PM     Type of Therapy:  Group Therapy  Participation Level:  Minimal  Participation Quality:  Minimal  Affect:  Flat, Depressed  Cognitive:   Appropriate  Insight: Developing/Improving   Engagement in Therapy: Developing/Imprvoing  Modes of Intervention:  Discussion Exploration  Education Rapport BuildingProblem-Solving Support  Summary of Progress/Problems:  The main focus of today's group was overcoming obstacles.  Patient attended group and remained throughout the session.  Patient stated he did not want to engage in discussion.   Wynn BankerHodnett, Ashika Apuzzo Hairston 04/03/2014   3:12 PM

## 2014-04-03 NOTE — Plan of Care (Signed)
Problem: Diagnosis: Increased Risk For Suicide Attempt Goal: STG-Patient Will Attend All Groups On The Unit Outcome: Progressing Patient reports that he has attended all group today along with AA this evening

## 2014-04-03 NOTE — Progress Notes (Signed)
Adult Psychoeducational Group Note  Date:  04/03/2014 Time:  10:00am Group Topic/Focus:  Wellness Toolbox:   The focus of this group is to discuss various aspects of wellness, balancing those aspects and exploring ways to increase the ability to experience wellness.  Patients will create a wellness toolbox for use upon discharge.  Participation Level:  Active  Participation Quality:  Appropriate and Attentive  Affect:  Appropriate  Cognitive:  Alert and Appropriate  Insight: Appropriate  Engagement in Group:  Engaged  Modes of Intervention:  Discussion and Education  Additional Comments:   Pt attended and participated in group. Question was asked What is the one thing you wish people understood about you and What is your best quality? Pt stated the one thing people understood about me is my depression and bipolar and the one good quality is he is a very good father, good Radiographer, therapeuticfisher and good husband.  Shelly BombardGarner, Jaylen Knope D 04/03/2014, 3:52 PM
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
## 2014-04-03 NOTE — [facility] Group Notes (Signed)
[facility] Note   04/03/2014 10:27 AM    Participation Quality:  Appropraite  Mood/Affect:  Appropriate  Depression Rating:  9  Anxiety Rating:  5  Thoughts of Suicide:  yes  Will you contract for safety?   Yes  Current AVH:  No  Plan for Discharge/Comments:  Patient attended discharge planning group and actively participated in group. Patient continue to endorses off/on SI.  He is able to contract for safety. Patient will [facility] with [facility] for [facility] services.  Suicide prevention education reviewed and SPE document provided.   Transportation Means: Patient has transportation.   Supports:  Patient has a support system.   [name], [name]

## 2014-04-03 NOTE — Progress Notes (Signed)
D: Patient has anxious affect and mood. He reported on the self inventory sheet that his sleep and appetite are both fair, energy level is low and poor ability to concentrate. Patient rates depression "8", feelings of hopelessness "6" and anxiety "4". He's participating in groups, interactive with peers in the dayroom and visible in the milieu. Adheres to medication regimen, however he did voice that the Cymbalta he's taking is not effective and would like to address this matter with the doctor; MD informed regarding the patient's concern.  A: Support and encouragement provided to patient. Administered scheduled medications per ordering MD. Monitor Q15 minute checks for safety.  R: Patient receptive. Denies SI/HI and AVH. Patient remains safe on the hall.

## 2014-04-03 NOTE — Plan of Care (Signed)
Problem: Diagnosis: Increased Risk For Suicide Attempt Goal: STG-Patient Will Comply With Medication Regime Outcome: Progressing Patient is compliant with scheduled medications.     

## 2014-04-04 LAB — VALPROIC ACID LEVEL: VALPROIC ACID LVL: 25.2 ug/mL — AB (ref 50.0–100.0)

## 2014-04-04 MED ORDER — GABAPENTIN 100 MG PO CAPS
200.0000 mg | ORAL_CAPSULE | Freq: Two times a day (BID) | ORAL | Status: DC
  Administered 2014-04-04 – 2014-04-06 (×4): 200 mg via ORAL
  Filled 2014-04-04 (×8): qty 2

## 2014-04-04 MED ORDER — DIVALPROEX SODIUM ER 500 MG PO TB24
500.0000 mg | ORAL_TABLET | Freq: Every day | ORAL | Status: DC
  Administered 2014-04-04 – 2014-04-06 (×3): 500 mg via ORAL
  Filled 2014-04-04 (×6): qty 1
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
## 2014-04-04 NOTE — Progress Notes (Signed)
Recreation Therapy Notes  Animal-Assisted Activity/Therapy (AAA/T) Program Checklist/Progress Notes Patient Eligibility Criteria Checklist & Daily Group note for Rec Tx Intervention  Date: 11.17.2015 Time: 2:45pm Location: [address] Dayroom    AAA/T Program Assumption of Risk Form signed by Patient/ or Parent Legal Guardian yes  Patient is free of allergies or sever asthma yes  Patient reports no fear of animals yes  Patient reports no history of cruelty to animals yes   Patient understands his/her participation is voluntary yes  Patient washes hands before animal contact yes  Patient washes hands after animal contact yes  Behavioral Response: Appropriate    Education: Hand Washing, Appropriate Animal Interaction   Education Outcome: Acknowledges education.   Clinical Observations/Feedback: Patient actively engaged in group session, petting therapy dog appropriately and interacting with peers appropriately. Additionally patient asked appropriate questions about therapy dog and his training.   Marykay Lexenise L Arin Vanosdol, LRT/CTRS  Auda Finfrock L 04/04/2014 4:16 PM

## 2014-04-04 NOTE — Tx Team (Signed)
Interdisciplinary Treatment Plan Update   Date Reviewed:  04/04/2014  Time Reviewed:  8:53 AM  Progress in Treatment:   Attending groups: Yes Participating in groups: Yes Taking medication as prescribed: Yes  Tolerating medication: Yes Family/Significant other contact made:  No, but will ask patient for consent for collateral contact Patient understands diagnosis: Yes, patient understands diagnosis and need for treatment. Discussing patient identified problems/goals with staff: Yes, patient is able to express goals for treatment and discharge. Medical problems stabilized or resolved: Yes Denies suicidal/homicidal ideation: Yes Patient has not harmed self or others: Yes  For review of initial/current patient goals, please see plan of care.  Estimated Length of Stay:  1 day  Reasons for Continued Hospitalization:  Anxiety Depression Medication stabilization   New Problems/Goals identified:    Discharge Plan or Barriers:   Home with outpatient follow [facility]  Additional Comments:  Continue medication stabilization  Patient and CSW reviewed patient's identified goals and treatment plan.  Patient verbalized understanding and agreed to treatment plan.   Attendees:  Patient:  04/04/2014 8:53 AM   Signature:  Sallyanne HaversF. Cobos, MD 04/04/2014 8:53 AM  Signature: 04/04/2014 8:53 AM  Signature:  Joslyn Devonaroline Beaudry, RN 04/04/2014 8:53 AM  Signature:   04/04/2014 8:53 AM  Signature:   04/04/2014 8:53 AM  Signature:  Juline PatchQuylle Daphyne Miguez, LCSW 04/04/2014 8:53 AM  Signature:  Belenda CruiseKristin Drinkard, LCSW-A 04/04/2014 8:53 AM  Signature:  Leisa LenzValerie Enoch, Care Coordinator [facility] 04/04/2014 8:53 AM  Signature:   04/04/2014 8:53 AM  Signature: Leighton ParodyBritney Tyson, RN 04/04/2014  8:53 AM  Signature:   Onnie BoerJennifer Clark, RN [facility] 04/04/2014  8:53 AM  Signature: 04/04/2014  8:53 AM    Scribe for Treatment Team:   Juline PatchQuylle Brandilynn Taormina,  04/04/2014 8:53 AM

## 2014-04-04 NOTE — [facility] Group Notes (Signed)
[facility] Group Notes:  stress  Date:  04/04/2014  Time:  9:32 AM  Type of Therapy:  Nurse Education  Participation Level:  Active  Participation Quality:  Appropriate  Affect:  Appropriate  Cognitive:  Alert  Insight:  Appropriate  Engagement in Group:  Engaged  Modes of Intervention:  Discussion  Summary of Progress/Problems:Pt attended group  Rodman KeyWebb, Joshlynn Alfonzo [facility] 04/04/2014, 9:32 AM
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
## 2014-04-04 NOTE — Progress Notes (Signed)
Patient ID: Donald ShinesRichard C Sullivan, male   DOB: 14-Nov-1980, 33 y.o.   MRN: [id number]   D: Pt wouldn't engage the writer in detailed conversation. When asked the circumstances surrounding his adm pt stated, "med adj". Pt stated he's "really depressed".  Pt stated he had no questions or concerns regarding his adm.  A:  Support and encouragement was offered. 15 min checks continued for safety.  R: Pt remains safe.

## 2014-04-04 NOTE — [facility] Group Notes (Signed)
[facility] LCSW Group Therapy      Feelings About Diagnosis 1:15 - 2:30 PM         04/04/2014    Type of Therapy:  Group Therapy  Participation Level:  Minimal  Participation Quality:  Appropriate  Affect:Depressed, Flat  Cognitive:   Appropriate  Insight:  Developing/Improving  Engagement in Therapy:  Developing/Improving   Modes of Intervention:  Discussion, Education, Exploration, Problem-Solving, Rapport Building, Support  Summary of Progress/Problems:  Patient did not engage in discussion.  Wynn BankerHodnett, Charly Hunton Hairston 04/04/2014

## 2014-04-04 NOTE — Plan of Care (Signed)
Problem: Alteration in mood; excessive anxiety as evidenced by: Goal: LTG-Patient's behavior demonstrates decreased anxiety Goal not met. Patient is rating anxiety at nine. Goal is for patient to rate anxiety at three or below on discharge.   Donald Medin Hodnett, LCSW 03/31/2014   Goal not met. Patient is rating anxiety at five . Goal is for patient to rate anxiety at three or below on discharge.   Donald Medin Hodnett, LCSW 04/03/2014 11:09 AM     (Patient's behavior demonstrates anxiety and he/she is utilizing learned coping skills to deal with anxiety-producing situations)  Outcome: Not Met (add Reason) Patient endorses anxiety 6 or higher.  Problem: Alteration in mood Goal: LTG-Patient reports reduction in suicidal thoughts (Patient reports reduction in suicidal thoughts and is able to verbalize a safety plan for whenever patient is feeling suicidal)  Outcome: Not Progressing Patient denies SI.

## 2014-04-04 NOTE — Progress Notes (Signed)
Pt attended spiritual care group on grief and loss facilitated by chaplain Burnis KingfisherMatthew Salvatrice Morandi and counseling intern [place] Austin. Group opened with brief discussion and psycho-social ed around grief and loss in relationships and in relation to self - identifying life patterns, circumstances, changes that cause losses. Established group norm of speaking from own life experience. Group goal of establishing open and affirming space for members to share loss and experience with grief, normalize grief experience and provide psycho social education and grief support.  Jewel was present throughout group.  Presented as restless and disengaged / preoccupied. When group facilitators prompted him with question of "what gives you courage," [name] identified his spouse and children.   Belva CromeStalnaker, Zeniya Lapidus Wayne MDiv  12:09 PM  04/04/2014

## 2014-04-05 NOTE — [facility] Group Notes (Signed)
[facility]   04/05/2014 11:58 AM    Participation Quality:  Appropraite  Mood/Affect: Depressed  Depression Rating:  8  Anxiety Rating:  8  Thoughts of Suicide: Yes  Will you contract for safety?   Yes  Current AVH:  No  Plan for Discharge/Comments:  Patient attended discharge planning group and actively participated in group. He will return home with wife and [facility]  Suicide prevention education reviewed and SPE document provided.   Transportation Means: Patient has transportation.   Supports:  Patient has a support system.   [name], [name]

## 2014-04-05 NOTE — Progress Notes (Signed)
D: When asked about his day pt stated "I'm really not well". Stated, "usually I want to leave, but not this time". Pt informed the writer that he's "been here 3 times in the last month".  Pt states he plans to talk to his dr in the morning to discuss his feelings.   A:  Support and encouragement was offered. 15 min checks continued for safety.  R: Pt remains safe.

## 2014-04-05 NOTE — Clinical Social Work Note (Signed)
Call from Toy Careobin Byerly, [facility] Care Coordinator [phone]), requesting update on patient and wanting to collaborate on discharge planning to see if additional resources are needed due to frequent rehospitalizations.  CSW [place] informed.  Santa GeneraAnne Cortlyn Cannell, LCSW Clinical Social Worker
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
## 2014-04-05 NOTE — Progress Notes (Signed)
D: Patient appropriate and cooperative with staff and peers. Patient continues to have anxious affect and mood. He declined completing the self inventory sheet today. Patient is actively participating in groups. He complained of bilateral knee pain. Patient is compliant with medication regimen.  A: Support and encouragement provided to patient. Scheduled medications administered per MD orders. PRN Oxycodone given for pain. Maintain Q15 minute checks for safety.  R: Patient receptive. Passive SI, but contracts for safety. Denies HI and auditory/visual hallucinations. Patient remains safe.

## 2014-04-05 NOTE — Progress Notes (Signed)
Patient ID: [name], male   DOB: [birth year], 33 y.o.   MRN: [id number] [name] MD Progress Note  04/05/2014 12:51 PM [name]  MRN:  [id number] Subjective:   Patient acknowledges some improvement, but continues to have fairly significant anxiety as he approaches discharge.     Objective:  I have discussed case with treatment team and met with patient. Patient has reported ongoing anxiety and depression to Nursing Staff. At this time he states he is feeling a little better , but does acknowledge anxiety in particular. He is tolerating medications  well, but is concerned that the antidepressant       " has not kicked in yet".  No disruptive behaviors at this time. Going to some groups. Thus far is tolerating medications well, and feels optimistic about Cymbalta helping to address depression and pain symptoms.  Of note, no seizures have been noted or reported on unit. We also met patient with SW, and discussed disposition planning - patient plans to return home and describes a supportive wife and home environment and plans to follow up with Dr. Darleene Cleaver for outpatient psychiatric services.  Diagnosis:  MDD-severe recurrent  Depressive Disorders:  Major Depressive Disorder - Severe (296.23) Total Time spent with patient: 20 minutes    ADL's:  Improved   Sleep: Fair  Appetite:  Improved   Suicidal Ideation:  Denies thoughts, intent plans or means Homicidal Ideation:  denies AEB (as evidenced by):  Psychiatric Specialty Exam: Physical Exam  ROS  Blood pressure 119/75, pulse 89, temperature 98 F (36.7 C), temperature source Oral, resp. rate 18, height _0  (1.702 m), weight 67.359 kg (148 lb 8 oz).Body mass index is 23.25 kg/(m^2).  General Appearance: improved grooming  Eye Contact::  Good  Speech:  Clear and Coherent  Volume:  Normal  Mood:  although improved compared to admission, reports ongoing depression and anxiety  Affect: anxious  Thought Process:  Goal  Directed  Orientation:  Full (Time, Place, and Person)  Thought Content:  no hallucinations, no delusions  Suicidal Thoughts:  No  at this time denies any thoughts of hurting self and  contracts for safety on unit   Homicidal Thoughts:  No  Memory:  NA  Judgement:  Fair  Insight:  Present  Psychomotor Activity:  Normal  Concentration:  Fair  Recall:  [place] of Knowledge:Good  Language: Good  Akathisia:  No  Handed:  Right  AIMS (if indicated):     Assets:  Communication Skills Desire for Improvement  Sleep:  Number of Hours: 5.25   Musculoskeletal: Strength & Muscle Tone: within normal limits Gait & Station: normal Patient leans: N/A  Current Medications: Current Facility-Administered Medications  Medication Dose Route Frequency Provider Last Rate Last Dose  . alum & mag hydroxide-simeth (MAALOX/MYLANTA) 200-200-20 MG/5ML suspension 30 mL  30 mL Oral Q4H PRN Waylan Boga, NP      . divalproex (DEPAKOTE ER) 24 hr tablet 1,000 mg  1,000 mg Oral QHS Elmarie Shiley, NP   1,000 mg at 04/01/14 2114  . DULoxetine (CYMBALTA) DR capsule 30 mg  30 mg Oral Daily Waylan Boga, NP   30 mg at 04/02/14 0830  . gabapentin (NEURONTIN) capsule 300 mg  300 mg Oral TID Waylan Boga, NP   300 mg at 04/02/14 1134  . ibuprofen (ADVIL,MOTRIN) tablet 800 mg  800 mg Oral TID PRN Waylan Boga, NP   800 mg at 03/30/14 2207  . indomethacin (INDOCIN) capsule 50 mg  50 mg Oral BID WC Waylan Boga, NP   50 mg at 04/02/14 0830  . magnesium hydroxide (MILK OF MAGNESIA) suspension 30 mL  30 mL Oral Daily PRN Waylan Boga, NP      . nicotine polacrilex (NICORETTE) gum 2 mg  2 mg Oral PRN Jenne Campus, MD   2 mg at 04/02/14 1057  . omega-3 acid ethyl esters (LOVAZA) capsule 1 g  1 g Oral Daily Waylan Boga, NP   1 g at 04/02/14 0829  . ondansetron (ZOFRAN) tablet 4 mg  4 mg Oral Q8H PRN Waylan Boga, NP      . oxyCODONE (Oxy IR/ROXICODONE) immediate release tablet 5 mg  5 mg Oral Q6H PRN Elmarie Shiley, NP   5 mg  at 04/02/14 1056  . QUEtiapine (SEROQUEL) tablet 100 mg  100 mg Oral QHS Elmarie Shiley, NP   100 mg at 04/01/14 2114  . traZODone (DESYREL) tablet 100 mg  100 mg Oral QHS Waylan Boga, NP   100 mg at 04/01/14 2114    Lab Results:  Results for orders placed or performed during the hospital encounter of 03/30/14 (from the past 48 hour(s))  Valproic acid level     Status: Abnormal   Collection Time: 04/04/14  6:45 AM  Result Value Ref Range   Valproic Acid Lvl 25.2 (L) 50.0 - 100.0 ug/mL    Comment: Performed at [facility]    Physical Findings: AIMS: Facial and Oral Movements Muscles of Facial Expression: None, normal Lips and Perioral Area: None, normal Jaw: None, normal Tongue: None, normal,Extremity Movements Upper (arms, wrists, hands, fingers): None, normal Lower (legs, knees, ankles, toes): None, normal, Trunk Movements Neck, shoulders, hips: None, normal, Overall Severity Severity of abnormal movements (highest score from questions above): None, normal Incapacitation due to abnormal movements: None, normal Patient's awareness of abnormal movements (rate only patient's report): No Awareness, Dental Status Current problems with teeth and/or dentures?: No Does patient usually wear dentures?: No  CIWA:    COWS:      Assessment:  Gradual improvement compared to admission status but still reporting significant anxiety and to a lesser degree, ongoing depression as well. He is not suicidal or psychotic and behavior is in good control- also he remains future oriented and is wanting to go home soon and reunite with family.One contributor to his current depression is anniversary of mother's passing away ( November)   Treatment Plan Summary: Daily contact with patient to assess and evaluate symptoms and progress in treatment Medication management  Plan: 1. Continue medication management and inpatient  Stabilization. At this time warrants ongoing inpatient treatment - staff to  continue working on disposition planning. 2. Continue  Seroquel 250 mg QHS 3. Continue  Trazodone 75 mgrs QHS 3. Continue Neurontin  200  mgrs BID-patient reports it does not help him.  4. Continue Cymbalta 60 mgrs QDAY 5. Continue Depakote ER  500 mgrs QAM and 1000 mgrs QHS 6. Patient is on Oxycodone 5 mgrs Q 6 hours PRN pain   Medical Decision Making Problem Points:  Established problem, stable/improving (1) Data Points:  Review of medication regiment & side effects (2)  I certify that inpatient services furnished can reasonably be expected to improve the patient's condition.   Neita Garnet, MD  12:51 PM 04/05/2014

## 2014-04-05 NOTE — Clinical Social Work Note (Signed)
CSW spoke with patient wife who stated she does not believe patient is ready for discharge.  She advised patient has been in the hospital five times over the past two months and she does not feel he is safe for her children who are one and two years old to be around.  She stated she will not allow patient to return to the home.  Wife advised she had to give patient CPR the last time he attempted suicide.  Wife, Morrie Sheldonshley, [phone], had called earlier threatening to take a 50B on patient if he did not give consent to talk with her.  She stated she has patient's money, car, and phone.  CSW stated to her that if she had the house, car, phone and money, what was patient to do, she responded, "that's his problem, I don't care."

## 2014-04-05 NOTE — [facility] Group Notes (Signed)
[facility] LCSW Group Therapy  Emotional Regulation 1:15 - 2: 30 PM        04/05/2014     Type of Therapy:  Group Therapy  Participation Level:  Appropriate  Participation Quality:  Appropriate  Affect:  Depressed, Flat  Cognitive:  Attentive Appropriate  Insight:  Developing/Improving Engaged  Engagement in Therapy:  Developing/Improving Engaged  Modes of Intervention:  Discussion Exploration Problem-Solving Supportive  Summary of Progress/Problems:  Group topic was emotional regulations.  Patient participated in the discussion and was able to identify an emotion that needed to regulated.  He talked about not having dealt with the death of multiple family members who died within a short time of each other.  Patient shared he plans to start counseling and hopes to be able to open up and talk about his feelings.  Patient also stated he has trust issues to overcome.  Patient was able to identify approprite coping skills.  Wynn BankerHodnett, Cristan Scherzer Hairston 04/05/2014

## 2014-04-06 MED ORDER — DIVALPROEX SODIUM ER 500 MG PO TB24: 500.0000 mg | ORAL_TABLET | Freq: Every day | ORAL | Status: DC

## 2014-04-06 MED ORDER — DULOXETINE HCL 60 MG PO CPEP: 60.0000 mg | ORAL_CAPSULE | Freq: Every day | ORAL | Status: AC

## 2014-04-06 MED ORDER — QUETIAPINE FUMARATE 50 MG PO TABS
250.0000 mg | ORAL_TABLET | Freq: Every day | ORAL | Status: DC
  Filled 2014-04-06: qty 5

## 2014-04-06 MED ORDER — TRAZODONE 25 MG HALF TABLET: 75.0000 mg | ORAL_TABLET | Freq: Every day | ORAL | Status: AC

## 2014-04-06 MED ORDER — DIVALPROEX SODIUM ER 500 MG PO TB24: 1000.0000 mg | ORAL_TABLET | Freq: Every day | ORAL | Status: DC

## 2014-04-06 MED ORDER — GABAPENTIN 100 MG PO CAPS: 200.0000 mg | ORAL_CAPSULE | Freq: Two times a day (BID) | ORAL | Status: AC

## 2014-04-06 MED ORDER — QUETIAPINE FUMARATE 50 MG PO TABS: 250.0000 mg | ORAL_TABLET | Freq: Every day | ORAL | Status: AC

## 2014-04-06 NOTE — Progress Notes (Signed)
Patient ID: Donald ShinesRichard C Sullivan, male   DOB: Jan 31, 1981, 33 y.o.   MRN: [id number] Mr. Barbara CowerUtley's wife will pick him up after she gets off work at 1500 or 1600.

## 2014-04-06 NOTE — Progress Notes (Signed)
Patient ID: Donald ShinesRichard C Leonhart, male   DOB: 1981-02-07, 33 y.o.   MRN: [id number] Pt was stable and denying SI upon discharge. Pt said he felt safe to leave. His belongings were returned, AVS was reviewed, and 14-day supply of meds was given.

## 2014-04-06 NOTE — Progress Notes (Signed)
Pt attended NA group this evening.  

## 2014-04-06 NOTE — Progress Notes (Signed)
Patient ID: Donald ShinesRichard C Sullivan, male   DOB: [birth year], 33 y.o.   MRN: [id number]   [name] has been calm and cooperative this a.m. He denies SI, HI, and a/v disturbances. He is eager for discharge. He admitted pain of 7 in his hips and legs that was reduced to a 1 after receiving PRN oxycodone. No further complaints. Will continue to monitor for needs and safety.

## 2014-04-06 NOTE — Clinical Social Work Note (Signed)
CSW received a message from patient's wife that she will allow patient to return to the home and will pick him up for discharge when she gets off work this afternoon.  She stated in the message that she believes patient is discharging too soon and will be coming back to the hospital.  Patient advised he and wife talked last night and things are better between them.

## 2014-04-06 NOTE — Plan of Care (Signed)
Problem: Alteration in mood; excessive anxiety as evidenced by: Goal: LTG-Patient's behavior demonstrates decreased anxiety Goal not met. Patient is rating anxiety at nine. Goal is for patient to rate anxiety at three or below on discharge.   . , LCSW 03/31/2014   Goal not met. Patient is rating anxiety at five . Goal is for patient to rate anxiety at three or below on discharge.   . , LCSW 04/03/2014 11:09 AM  Goal not met. Patient is rating anxiety at three. Goal is for patient to rate anxiety at three or below on discharge.   . , LCSW 04/06/2014 10:24 AM       (Patient's behavior demonstrates anxiety and he/she is utilizing learned coping skills to deal with anxiety-producing situations)  Outcome: Completed/Met Date Met:  04/06/14     

## 2014-04-06 NOTE — Progress Notes (Signed)
[facility] :  Will you be returning to the same living situation after discharge: Yes,  Patient is returning home with wife. At discharge, do you have transportation home?:Yes,  Patient advised wife will transport him at discharge. Do you have the ability to pay for your medications:Yes,  Patient has Medicaid.  Release of information consent forms completed and in the chart;  Patient's signature needed at discharge.  Patient to Follow up at: Follow-up Information    Follow up with Dr. Jannifer FranklinAkintayo [facility] On 05/05/2014.   Why:  You are scheduled with Dr. Jannifer FranklinAkintayo on Friday, May 05, 2014 at 1:30 PM   Contact information:   [address] [place], [place]   [address]  [phone]      Follow up with [facility]erraci [facility] On 04/10/2014.   Why:  You are scheduled with Aquilla Hackerraci Anthony on Monday, November 276m, 2015 at 1:00PM   Contact information:   [address]  [place], [place]  [address]  [phone]      Patient denies SI/HI:  Patient no longer endorsing SI/HI or other thoughts of self harm. ]    Safety Planning and Suicide Prevention discussed: .Reviewed with all patients during discharge planning group   Lucrezia Dehne, Joesph JulyQuylle Hairston 04/06/2014, 9:57 AM
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
## 2014-04-08 MED ORDER — DIVALPROEX SODIUM ER 500 MG PO TB24: 1000.0000 mg | ORAL_TABLET | Freq: Every day | ORAL | Status: AC

## 2014-04-08 MED ORDER — DIVALPROEX SODIUM ER 500 MG PO TB24: 500.0000 mg | ORAL_TABLET | Freq: Every day | ORAL | Status: AC

## 2014-05-15 ENCOUNTER — Other Ambulatory Visit (HOSPITAL_COMMUNITY): Payer: Self-pay | Admitting: Psychiatry

## 2014-05-18 NOTE — Progress Notes (Signed)
Patient Discharge Instructions:  After Visit Summary (AVS):   Faxed to:  05/18/14 Discharge Summary Note:   Faxed to:  05/18/14 Psychiatric Admission Assessment Note:   Faxed to:  05/18/14 Suicide Risk Assessment - Discharge Assessment:   Faxed to:  05/18/14 Faxed/Sent to the Next Level Care provider:  05/18/14  Faxed to [facility] @ [phone] Faxed to [facility] @ [phone]  Jerelene ReddenSheena E Avella, 05/18/2014, 9:31 AM

## 2014-05-29 IMAGING — CR DG CHEST 2V
2 series · 2 of 2 positions shown · non-contrast
Comparison: 01/05/2009

CLINICAL DATA: Sudden onset of chest pain and pressure was
shortness of breath.

CHEST - 2 VIEW

[w chest pa]
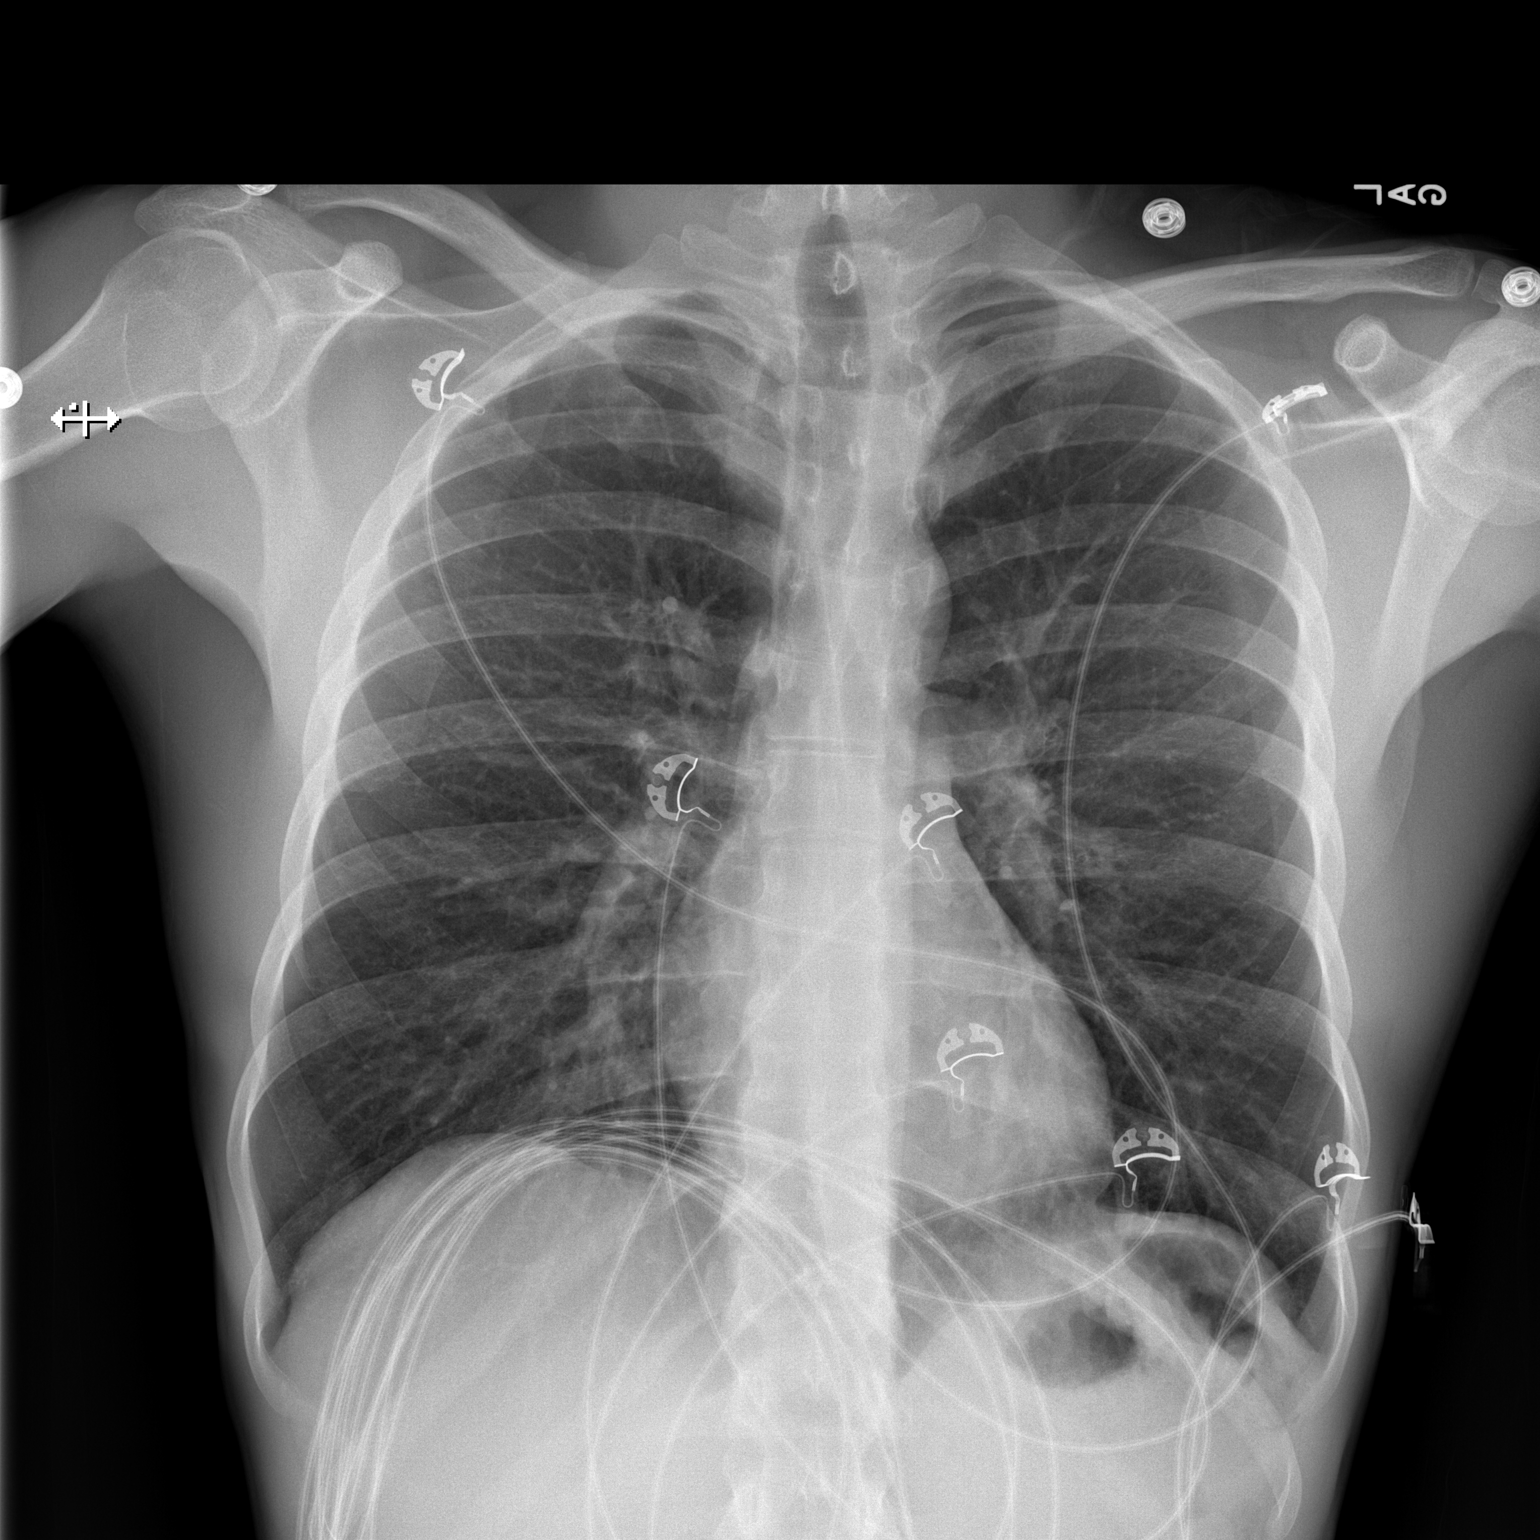

[w chest lat]
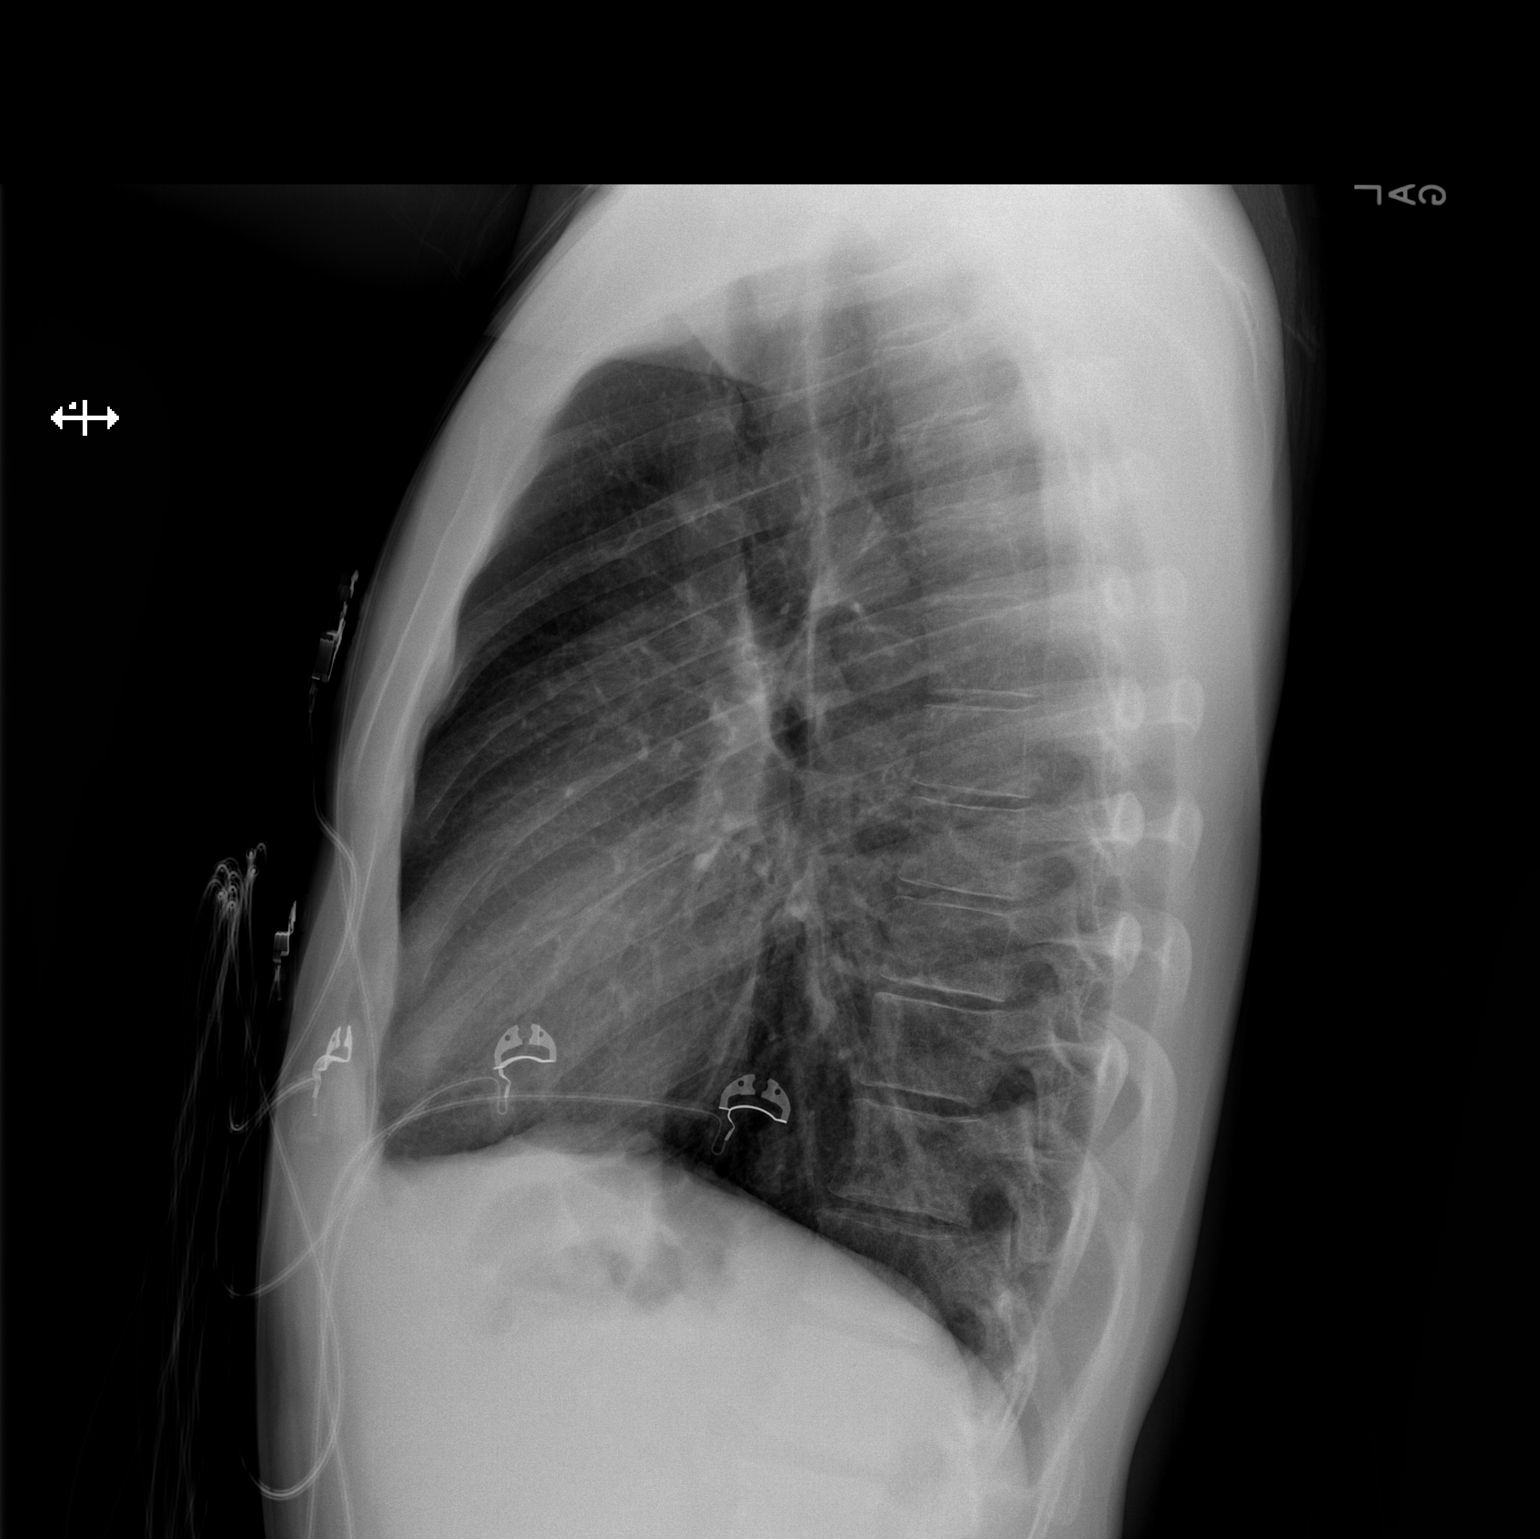

[2 of 2 positions shown; findings below may reference images not displayed]

FINDINGS: Normal heart size and pulmonary vascularity.  Mild
pulmonary hyperinflation with some.  Bronchial thickening
suggesting asthma or reactive airways disease.  No focal airspace
consolidation in the lungs.  No blunting of costophrenic angles.
No pneumothorax.  No significant changes since the previous study.
IMPRESSION: Mild pulmonary hyperinflation with peribronchial thickening
suggesting reactive airways disease or asthma.  No focal
consolidation.

## 2016-08-19 ENCOUNTER — Other Ambulatory Visit: Payer: Self-pay | Admitting: Neurology

## 2016-08-19 DIAGNOSIS — R569 Unspecified convulsions: Secondary | ICD-10-CM

## 2016-08-27 ENCOUNTER — Ambulatory Visit (HOSPITAL_COMMUNITY)
Admission: RE | Admit: 2016-08-27 | Discharge: 2016-08-27 | Disposition: A | Discharge status: Home / Self Care | Payer: Medicaid Other | Source: Ambulatory Visit | Attending: Neurology | Admitting: Neurology

## 2016-08-27 DIAGNOSIS — R569 Unspecified convulsions: Secondary | ICD-10-CM

## 2016-08-27 NOTE — Progress Notes (Signed)
OP EEG completed, results pending. 

## 2016-08-27 NOTE — Procedures (Signed)
ELECTROENCEPHALOGRAM REPORT  Date of Study: 08/27/2016  Patient's Name: [name] MRN: [id number] Date of Birth: 11-23-1980  Referring Provider: Dr. Oleh Genin  Clinical History: This is a 36 year old man with TBI, seizures, and headaches.  Medications: gabapentin (NEURONTIN) 100 MG capsule  divalproex (DEPAKOTE ER) 500 MG 24 hr tablet  DULoxetine (CYMBALTA) 60 MG capsule  indomethacin (INDOCIN) 50 MG capsule  omega-3 acid ethyl esters (LOVAZA) 1 G capsule  QUEtiapine (SEROQUEL) 50 MG tablet  traZODone (DESYREL) 25 mg TABS tablet   Technical Summary: A multichannel digital EEG recording measured by the international 10-20 system with electrodes applied with paste and impedances below 5000 ohms performed in our laboratory with EKG monitoring in an awake and asleep patient.  Hyperventilation and photic stimulation were performed.  The digital EEG was referentially recorded, reformatted, and digitally filtered in a variety of bipolar and referential montages for optimal display.    Description: The patient is awake and asleep during the recording.  During maximal wakefulness, there is a symmetric, medium voltage 9-9.5 Hz posterior dominant rhythm that attenuates with eye opening.  The record is symmetric.  During drowsiness and stage I sleep, there is an increase in theta slowing of the background, at times sharply contoured over the bilateral temporal regions, without clear epileptogenic potential. Rare vertex waves were seen.  Hyperventilation and photic stimulation did not elicit any abnormalities.  There were no clear epileptiform discharges or electrographic seizures seen.    EKG lead was unremarkable.  Impression: This awake and asleep EEG is normal.    Clinical Correlation: A normal EEG does not exclude a clinical diagnosis of epilepsy.  Clinical correlation is advised.   Patrcia Dolly, M.D.
